# Patient Record
Sex: Female | Born: 2000 | Race: Asian | Hispanic: No | Marital: Married | Smoking: Never smoker
Health system: Southern US, Community
[De-identification: ages and names within clinical notes are randomized; demographics above are authoritative.]

## PROBLEM LIST (undated history)

## (undated) DIAGNOSIS — R569 Unspecified convulsions: Secondary | ICD-10-CM

## (undated) HISTORY — DX: Unspecified convulsions: R56.9

---

## 2020-05-07 ENCOUNTER — Ambulatory Visit (INDEPENDENT_AMBULATORY_CARE_PROVIDER_SITE_OTHER): Payer: Self-pay | Admitting: Family Medicine

## 2020-05-07 ENCOUNTER — Other Ambulatory Visit: Payer: Self-pay

## 2020-05-07 VITALS — BP 104/62 | HR 90 | Ht 65.0 in | Wt 110.4 lb

## 2020-05-07 DIAGNOSIS — R569 Unspecified convulsions: Secondary | ICD-10-CM

## 2020-05-07 DIAGNOSIS — Z0289 Encounter for other administrative examinations: Secondary | ICD-10-CM

## 2020-05-07 DIAGNOSIS — Z3400 Encounter for supervision of normal first pregnancy, unspecified trimester: Secondary | ICD-10-CM

## 2020-05-07 LAB — POCT URINALYSIS DIP (MANUAL ENTRY)
Bilirubin, UA: NEGATIVE
Blood, UA: NEGATIVE
Glucose, UA: NEGATIVE mg/dL
Ketones, POC UA: NEGATIVE mg/dL
Leukocytes, UA: NEGATIVE
Nitrite, UA: NEGATIVE
Protein Ur, POC: NEGATIVE mg/dL
Spec Grav, UA: 1.01 (ref 1.010–1.025)
Urobilinogen, UA: 0.2 E.U./dL
pH, UA: 6 (ref 5.0–8.0)

## 2020-05-07 NOTE — Patient Instructions (Addendum)
Referring to neurologist and high risk OB clinic Scheduling anatomy ultrasound  Go to the MAU at [facility] at [facility] if:  You have pain in your lower abdomen or pelvic area  Your water breaks.  Sometimes it is a big gush of fluid, sometimes it is just a trickle that keeps getting your underwear wet or running down your legs  You have vaginal bleeding.

## 2020-05-09 DIAGNOSIS — Z0289 Encounter for other administrative examinations: Secondary | ICD-10-CM | POA: Insufficient documentation

## 2020-05-09 DIAGNOSIS — Z3400 Encounter for supervision of normal first pregnancy, unspecified trimester: Secondary | ICD-10-CM | POA: Insufficient documentation

## 2020-05-09 LAB — CULTURE, OB URINE

## 2020-05-09 LAB — URINE CULTURE, OB REFLEX

## 2020-05-09 NOTE — Progress Notes (Signed)
Patient Name: Gabrielle Wilson Date of Birth: [birth year]   [facility] Initial Prenatal Visit  Gabrielle Wilson is a 19 y.o. year old G1P0 at 17wd who presents for her initial prenatal visit, seen in faculty OB clinic given urgency of care need. Patient is a recently arrived [place] refugee, has been in [place] for 5 days. She presents with her husband, Gabrielle Wilson. Pashto interpreter utilized during entire encounter.   Patient denies any vaginal bleeding or fluid leaking. Does feel baby move occasionally. Has had abdominal pain intermittently throughout this pregnancy, previously evaluated and deemed normal. Responds well to tylenol. She is taking a PNV.  Prenatal History: Pregnancy is dated via 63w4dultrasound (records available, will be scanned in).  Available prenatal labs from 02/21/20 reviewed and unremarkable. To summarize:  B+, Antibody screen negative, HbSAg negative, HIV negative, RPR nonreactive, rubella immune, Hgb 12.6. Quant gold negative.  Has NOT had hemoglobin electophoresis, OB urine culture, hep C, or varicella.   Has received immunizations for Tdap, COVID (2 doses), IPV, influenza. Also received MMR and varicella (these seem to have been administered prior to recognition of pregnancy).  Past Medical History:  No prior surgeries. Other than PNV and tylenol takes no medications regularly. This is her first pregnancy Denies any history of STI or HSV, or partner with HSV. History of abdominal infection at age 19-5 took medications and resolved. No further information available about this illness.  Reports history of seizures. Occur sporadically (approximately monthly, sometimes more, sometimes less). Episodes consist of falling to the ground and full body shaking. Has injured herself during these episodes in the past. Has full LOC during the episodes, but no loss of bladder control or tongue biting.  Not on any medications for this. Reports was previously  evaluated by a medical provider for this back in [place] and the issue was deemed to be "religious" in nature. They went to a religious healer and now when she has the episodes they pray for her until she improves. Episodes tend to happen more when she is stressed or upset. Husband is afraid to leave her alone for any length of time, as he is afraid she will get injured if she has another episode. He requests that his sister and brother in law be allowed to live with them to help monitor patient for her own safety.  Social History: [place] Here with her husband, also husband's sister and sister's husband. Left a lot of family back home in [place] and they are understandably very concerned about their families.  Denies smoking, drinking, or drug use  Family History: Mother with hypertension, otherwise negative No cancers in family No babies born with issues, or pregnancy-related complications in the family  Exam: BP 104/62   Pulse 90   Ht 5' 5" (1.651 m)   Wt 110 lb 6.4 oz (50.1 kg)   LMP 01/13/2020 (Approximate)   BMI 18.37 kg/m   Gen: Well nourished, well developed.  No distress.  HEENT: Normocephalic, atraumatic.  Neck supple without cervical lymphadenopathy, thyromegaly or thyroid nodules.  Fair dentition. CV: RRR no murmur, gallops or rubs Lungs: CTA B.  Normal respiratory effort without wheezes or rales. Abd: soft, NTND.  Uterus gravid, below umbilicus GU: exam deferred today by patient request Ext: No clubbing, cyanosis or edema. Psych: Normal grooming and dress.   Fetal heart tones: 158bpm  Assessment/Plan:  KSariah Henkinis a 19y.o. G1P0 newly arrived [place] at 152w0dho presents to  initiate prenatal care here in [place]  1. Routine prenatal care: - dating is reliable, based on u/s - available prenatal labs are unremarkable. Will draw full prenatal panel here for our records, along with hep C, hemoglobin electrophoresis,  varicella titer, urinalysis, OB urine culture. Also check lead level given refugee status. - continue prenatal vitamins - offered genetic screening, patient declined - need to attempt to obtain gc/chlamydia at next appointment. Patient strongly preferred to not do pelvic exam today. I did not push this today given ongoing trauma of resettlement. Advised will revisit at next appointment, patient agreeable. - anatomy ultrasound ordered and scheduled  - given history of seizures (see below) will need to transfer care to [facility] clinic - reviewed precautions/reasons to go to MAU  2. History of "seizures":  - has not had any evaluation of this since arriving in the [place], no records available of any prior workup. 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  - As our referral coordinator is out presently, I will plan to personally contact both neurology and [place] clinic next week to get these appts scheduled. Will then forward appointment information to refugee resettlement case worker. - Will also advocate with case worker that patient's husband's family be allowed to share housing with them in order to provide supervision for patient.   3. Abdominal pain - noted throughout pregnancy according to prenatalrecords. Seems benign, likely typical uterine ligament-related pains, responds to tylenol - continue to monitor   Next prenatal visit in 4 weeks (here if cannot get in with [facility] by then).  Gabrielle Netters, MD [place]

## 2020-05-09 NOTE — Assessment & Plan Note (Signed)
Has not had any evaluation of this since arriving in the [place], no records available of any prior workup. 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  - As our referral coordinator is out presently, I will plan to personally contact both neurology and [facility] clinic next week to get these appts scheduled. Will then forward appointment information to refugee resettlement case worker. - Will also advocate with case worker that patient's husband's family be allowed to share housing with them in order to provide supervision for patient.

## 2020-05-11 ENCOUNTER — Encounter: Payer: Self-pay | Admitting: Family Medicine

## 2020-05-11 ENCOUNTER — Telehealth: Payer: Self-pay | Admitting: Family Medicine

## 2020-05-11 NOTE — Telephone Encounter (Signed)
Care Coordination Note  - called and got patient scheduled for High Risk OB clinic visit with Dr. Crissie Reese on 1/4 at 8:55am  - called [facility] office to request appointment, was told referral needs to be reviewed by MD first. I have sent a message to Dr. Karel Jarvis explaining the need for urgent neuro referral (pregnant with likely seizure disorder). Provided neurology scheduler contact info for patient's case worker (see info below) so they can communicate appointment info through case worker.  Doha Altaki Case [employer] [place] [employer] Refuge[employer] [facility] CWS Office Phone: [phone] Work Cell: [phone]  - uploaded available refugee health records (including prenatal records) into patient's chart, available under media.  - sent information about above appts/referrals as well as anatomy ultrasound appointment to [facility].  Latrelle Dodrill, MD

## 2020-05-12 ENCOUNTER — Other Ambulatory Visit: Payer: Self-pay

## 2020-05-12 ENCOUNTER — Encounter: Payer: Self-pay | Admitting: Neurology

## 2020-05-12 ENCOUNTER — Ambulatory Visit (INDEPENDENT_AMBULATORY_CARE_PROVIDER_SITE_OTHER): Payer: Self-pay | Admitting: Neurology

## 2020-05-12 VITALS — BP 120/84 | HR 115 | Resp 20 | Wt 111.0 lb

## 2020-05-12 DIAGNOSIS — R569 Unspecified convulsions: Secondary | ICD-10-CM

## 2020-05-12 LAB — OBSTETRIC PANEL, INCLUDING HIV
Antibody Screen: NEGATIVE
Basophils Absolute: 0 10*3/uL (ref 0.0–0.2)
Basos: 0 %
EOS (ABSOLUTE): 0.4 10*3/uL (ref 0.0–0.4)
Eos: 5 %
HIV Screen 4th Generation wRfx: NONREACTIVE
Hematocrit: 28.9 % — ABNORMAL LOW (ref 34.0–46.6)
Hemoglobin: 9.7 g/dL — ABNORMAL LOW (ref 11.1–15.9)
Hepatitis B Surface Ag: NEGATIVE
Immature Grans (Abs): 0 10*3/uL (ref 0.0–0.1)
Immature Granulocytes: 0 %
Lymphocytes Absolute: 2.3 10*3/uL (ref 0.7–3.1)
Lymphs: 33 %
MCH: 29.3 pg (ref 26.6–33.0)
MCHC: 33.6 g/dL (ref 31.5–35.7)
MCV: 87 fL (ref 79–97)
Monocytes Absolute: 0.4 10*3/uL (ref 0.1–0.9)
Monocytes: 6 %
Neutrophils Absolute: 3.7 10*3/uL (ref 1.4–7.0)
Neutrophils: 56 %
Platelets: 225 10*3/uL (ref 150–450)
RBC: 3.31 x10E6/uL — ABNORMAL LOW (ref 3.77–5.28)
RDW: 13.4 % (ref 11.7–15.4)
RPR Ser Ql: NONREACTIVE
Rh Factor: POSITIVE
Rubella Antibodies, IGG: 2.6 index (ref >0.99)
WBC: 6.8 10*3/uL (ref 3.4–10.8)

## 2020-05-12 LAB — HGB FRACTIONATION BY HPLC
Hgb A: 55.4 % — ABNORMAL LOW (ref 96.4–98.8)
Hgb C: 0 %
Hgb E: 0 %
Hgb F: 0 % (ref 0.0–2.0)
Hgb S: 0 %
Hgb Variant: 42.6 % — ABNORMAL HIGH

## 2020-05-12 LAB — HCV AB W REFLEX TO QUANT PCR: HCV Ab: <0.1 s/co ratio (ref 0.0–0.9)

## 2020-05-12 LAB — HCV INTERPRETATION

## 2020-05-12 LAB — HGB FRACTIONATION CASCADE: Hgb A2: 2 % (ref 1.8–3.2)

## 2020-05-12 LAB — VARICELLA ZOSTER ANTIBODY, IGG: Varicella zoster IgG: 645 index (ref >165)

## 2020-05-12 LAB — LEAD, BLOOD (ADULT >= 16 YRS): Lead-Whole Blood: 1 ug/dL (ref 0–4)

## 2020-05-12 NOTE — Progress Notes (Signed)
NEUROLOGY CONSULTATION NOTE  Gabrielle Wilson MRN: [id number] DOB: [birth year]  Referring provider: Dr. Levert Feinstein Primary care provider: Dr. Levert Feinstein  Reason for consult:  Seizures, pregnant  Dear Dr Pollie Meyer:  Thank you for your kind referral of Gabrielle Wilson for consultation of the above symptoms. Although her history is well known to you, please allow me to reiterate it for the purpose of our medical record. The patient was accompanied to the clinic by her husband who also provides collateral information. A Dari medical interpreter [phone]) is present to help with translation. Records and images were personally reviewed where available.   HISTORY OF PRESENT ILLNESS: This is a 19 year old G1P0 at 73 wd right-handed woman presenting for evaluation of seizures.  She is a recently arrived [place] refugee and has been in [place] for over a week. Her husband Theador Hawthorne is present to provide history as well. They report that seizures started in childhood, she states that when she gets upset or feeling down, she goes through these episodes. There is no clear warning except sometimes she may have a little abdominal pain. When she wakes up, her whole body is in pain, including her teeth/tongue/mouth. Her husband has not been with her for a long time, he has witnessed 2 seizures but the women in the house covered her from him and his mother told him she has had these episodes since childhood. She bit her tongue that time. She denies nocturnal seizures. They deny any staring/unresponsive episodes, no  olfactory/gustatory hallucinations, rising epigastric sensation, focal numbness/tingling/weakness, myoclonic jerks. Sometimes she would feel very weak and her hand shivers and she cannot do anything. She has frequent headaches over the frontal region, when severe she gets nauseated, no vomiting. She is sensitive to bright lights and sounds. She does not take any medication. Family took her to  medical and religious treatment where she was given ?herbal treatments and her body was getting a little bit calm. They report that they have not had any tests because this is something God has given to her, family would recite the [place]. She denies any history of significant head injuries or family history of seizures.    PAST MEDICAL HISTORY: Past Medical History:  Diagnosis Date  . Seizure (HCC)     PAST SURGICAL HISTORY: History reviewed. No pertinent surgical history.  MEDICATIONS: Current Outpatient Medications on File Prior to Visit  Medication Sig Dispense Refill  . acetaminophen (TYLENOL) 500 MG tablet Take 500 mg by mouth every 6 (six) hours as needed.    . Prenatal Vit-Fe Fumarate-FA (MULTIVITAMIN-PRENATAL) 27-0.8 MG TABS tablet Take 1 tablet by mouth daily.     No current facility-administered medications on file prior to visit.    ALLERGIES: No Known Allergies  FAMILY HISTORY: History reviewed. No pertinent family history.  SOCIAL HISTORY: Social History   Socioeconomic History  . Marital status: Married    Spouse name: Not on file  . Number of children: Not on file  . Years of education: Not on file  . Highest education level: Not on file  Occupational History  . Not on file  Tobacco Use  . Smoking status: Never Smoker  . Smokeless tobacco: Never Used  Vaping Use  . Vaping Use: Never used  Substance and Sexual Activity  . Alcohol use: Not Currently  . Drug use: Not Currently  . Sexual activity: Not on file  Other Topics Concern  . Not on file  Social History Narrative   Right handed  Lives with her family husband and sister-in -Social worker   Social Determinants of Corporate investment banker Strain: Not on file  Food Insecurity: Not on file  Transportation Needs: Not on file  Physical Activity: Not on file  Stress: Not on file  Social Connections: Not on file  Intimate Partner Violence: Not on file     PHYSICAL EXAM: Vitals:   05/12/20 1241   BP: 120/84  Pulse: (!) 115  Resp: 20  SpO2: 100%   General: No acute distress Head:  Normocephalic/atraumatic Skin/Extremities: No rash, no edema Neurological Exam: Mental status: alert and awake. No dysarthria or aphasia, Fund of knowledge is appropriate. Attention and concentration are normal.    Cranial nerves: CN I: not tested CN II: pupils equal, round and reactive to light, visual fields intact CN III, IV, VI:  full range of motion, no nystagmus, no ptosis CN V: facial sensation intact CN VII: upper and lower face symmetric CN VIII: hearing intact to conversation Bulk & Tone: normal, no fasciculations. Motor: 5/5 throughout with no pronator drift. Sensation: intact to light touch, cold, pin, vibration and joint position sense.  No extinction to double simultaneous stimulation.  Romberg test negative Deep Tendon Reflexes: +1 throughout Cerebellar: no incoordination on finger to nose testing Gait: narrow-based and steady, able to tandem walk adequately. Tremor: none   IMPRESSION: This is a 19 year old G1P0 at 55 wd right-handed woman presenting for evaluation of seizures. Seizures suggestive of generalized epilepsy, however she also reports that they occur during times of increased emotions. A 1-hour EEG will be ordered to classify her seizures. Low threshold to start medication, she is pregnant, will consider Levetiracetam after EEG completed. She does not drive. Follow-up in 2 months, they know to call for any changes.   Thank you for allowing me to participate in the care of this patient. Please do not hesitate to call for any questions or concerns.   Patrcia Dolly, M.D.  CC: Dr. Pollie Meyer

## 2020-05-12 NOTE — Progress Notes (Signed)
Interpreter [phone] used for this appointment pt speaks Pashto not Dari.

## 2020-05-12 NOTE — Patient Instructions (Signed)
1. Schedule 1-hour EEG  2. Follow-up after EEG, call for any changes

## 2020-05-23 NOTE — L&D Delivery Note (Addendum)
Delivery Note Gabrielle Wilson is a 20 y.o. G1P0000 at [redacted]w[redacted]d admitted for early labor.   GBS Status: NEGATIVE/-- (05/30 0639) Maximum Maternal Temperature: 98.2  Labor course: Initial SVE: 3/80/-2. Augmentation with: AROM. She then progressed to complete.  ROM: 2h 45m with light meconium stained fluid  Birth: At 1352 a viable female was delivered via spontaneous vaginal delivery (Presentation: LOA). Nuchal cord present: No.  Shoulders and body delivered in usual fashion. Infant placed directly on mom's abdomen for bonding/skin-to-skin, baby dried and stimulated. Cord clamped x 2 after 1 minute and cut by FOB. Skin peeling on feet. Cord blood collected.  The placenta separated spontaneously and delivered via gentle cord traction.  Pitocin infused rapidly IV per protocol.  Fundus firm with massage.  Placenta inspected and appears to be intact with a 3 VC.  Placenta/Cord with the following complications: none .  Cord pH: not done Sponge and instrument count were correct x2.  Intrapartum complications:  None Anesthesia:  IV Fentanyl Episiotomy: none Lacerations:  Hemostatic Lt periurethral-not repaired Suture Repair: n/a EBL (mL): 50   Infant: APGAR (1 MIN): 8   APGAR (5 MINS): 9   APGAR (10 MINS):    Infant weight: pending  Mom to postpartum.  Baby to Couplet care / Skin to Skin. Placenta to L&D   Plans to Breastfeed Contraception: condoms Circumcision: N/A  Note sent to [facility]: [facility] for pp visit.  Cheral Marker CNM, [facility] 10/19/2020 2:03 PM

## 2020-05-25 ENCOUNTER — Other Ambulatory Visit: Payer: Self-pay

## 2020-05-26 ENCOUNTER — Encounter: Payer: Self-pay | Admitting: Family Medicine

## 2020-06-05 ENCOUNTER — Ambulatory Visit (INDEPENDENT_AMBULATORY_CARE_PROVIDER_SITE_OTHER): Payer: Self-pay | Admitting: Family Medicine

## 2020-06-05 ENCOUNTER — Other Ambulatory Visit: Payer: Self-pay

## 2020-06-05 ENCOUNTER — Encounter: Payer: Self-pay | Admitting: Family Medicine

## 2020-06-05 ENCOUNTER — Other Ambulatory Visit: Payer: Self-pay | Admitting: *Deleted

## 2020-06-05 ENCOUNTER — Other Ambulatory Visit: Payer: Self-pay | Admitting: Family Medicine

## 2020-06-05 ENCOUNTER — Ambulatory Visit: Payer: Medicaid Other | Attending: Family Medicine

## 2020-06-05 VITALS — BP 124/72 | HR 105 | Wt 116.2 lb

## 2020-06-05 DIAGNOSIS — Z3400 Encounter for supervision of normal first pregnancy, unspecified trimester: Secondary | ICD-10-CM

## 2020-06-05 DIAGNOSIS — Z363 Encounter for antenatal screening for malformations: Secondary | ICD-10-CM | POA: Insufficient documentation

## 2020-06-05 DIAGNOSIS — R569 Unspecified convulsions: Secondary | ICD-10-CM

## 2020-06-05 DIAGNOSIS — Z3402 Encounter for supervision of normal first pregnancy, second trimester: Secondary | ICD-10-CM | POA: Diagnosis present

## 2020-06-05 DIAGNOSIS — Z789 Other specified health status: Secondary | ICD-10-CM

## 2020-06-05 DIAGNOSIS — Z362 Encounter for other antenatal screening follow-up: Secondary | ICD-10-CM

## 2020-06-05 NOTE — Progress Notes (Signed)
Rescheduled pt's EEG at [facility] on 06/17/20 @ 1415.  Pt notified.    Addison Naegeli, RN  06/05/20

## 2020-06-05 NOTE — Progress Notes (Addendum)
  Subjective:  Gabrielle Wilson is a G1P0000 [redacted]w[redacted]d being seen today for her first obstetrical visit. She received first trimester care in [place]. Her obstetrical history is significant for possible seizure disorder. This is her first pregnancy. Desired pregnancy. Patient does intend to breast feed. Pregnancy history fully reviewed.  Patient reports no complaints.  BP 124/72   Pulse (!) 105   Wt 116 lb 3.2 oz (52.7 kg)   LMP 01/13/2020 (Approximate)   BMI 19.34 kg/m   HISTORY: OB History  Gravida Para Term Preterm AB Living  1 0 0 0 0 0  SAB IAB Ectopic Multiple Live Births  0 0 0 0 0    # Outcome Date GA Lbr Len/2nd Weight Sex Delivery Anes PTL Lv  1 Current             Past Medical History:  Diagnosis Date  . Seizure [facility])     History reviewed. No pertinent surgical history.  No family history on file.   Exam  BP 124/72   Pulse (!) 105   Wt 116 lb 3.2 oz (52.7 kg)   LMP 01/13/2020 (Approximate)   BMI 19.34 kg/m   Chaperone present during exam  CONSTITUTIONAL: Well-developed, well-nourished female in no acute distress.  HENT:  Normocephalic, atraumatic, External right and left ear normal. Oropharynx is clear and moist EYES: Conjunctivae and EOM are normal. Pupils are equal, round, and reactive to light. No scleral icterus.  NECK: Normal range of motion, supple, no masses. CARDIOVASCULAR: Normal heart rate noted, regular rhythm RESPIRATORY: Clear to auscultation bilaterally. Effort and breath sounds normal, no problems with respiration noted. BREASTS: declined ABDOMEN: Soft, normal bowel sounds, no distention noted.  No tenderness, rebound or guarding.  PELVIC: declined MUSCULOSKELETAL: Normal range of motion. No tenderness.  No cyanosis, clubbing, or edema.  2+ distal pulses. SKIN: Skin is warm and dry. No rash noted. Not diaphoretic. No erythema. No pallor. NEUROLOGIC: Alert and oriented to person, place, and time. Normal reflexes, muscle tone coordination.  No cranial nerve deficit noted. PSYCHIATRIC: Normal mood and affect. Normal behavior. Normal judgment and thought content.    Assessment:    Pregnancy: G1P0000 Patient Active Problem List   Diagnosis Date Noted  . Supervision of normal first pregnancy 05/09/2020  . Refugee health examination 05/09/2020  . Seizures (HCC)       Plan:   1. Supervision of normal first pregnancy, antepartum Discussed delivery at [facility].  [place] today[place] refugee [place] - normal PNL> - CHL AMB BABYSCRIPTS SCHEDULE OPTIMIZATION  2. Seizures (HCC) Has seen neurologist. Missed EEG. Will try to get her set up for the EEG. Has f/u with the neurologist in about a month.  3. Language barrier Interpreter used   Problem list reviewed and updated. 75% of 30 min visit spent on counseling and coordination of care.     Levie Heritage 06/05/2020

## 2020-06-10 ENCOUNTER — Other Ambulatory Visit: Payer: Self-pay | Admitting: Family Medicine

## 2020-06-10 ENCOUNTER — Encounter: Payer: Self-pay | Admitting: Family Medicine

## 2020-06-10 DIAGNOSIS — D582 Other hemoglobinopathies: Secondary | ICD-10-CM

## 2020-06-10 DIAGNOSIS — Z148 Genetic carrier of other disease: Secondary | ICD-10-CM

## 2020-06-10 HISTORY — DX: Other hemoglobinopathies: D58.2

## 2020-06-10 HISTORY — DX: Genetic carrier of other disease: Z14.8

## 2020-06-15 NOTE — Progress Notes (Signed)
Verified with MFM patient will be scheduled for genetic counseling appt.

## 2020-06-17 ENCOUNTER — Ambulatory Visit (INDEPENDENT_AMBULATORY_CARE_PROVIDER_SITE_OTHER): Payer: Medicaid Other | Admitting: Neurology

## 2020-06-17 ENCOUNTER — Other Ambulatory Visit: Payer: Self-pay

## 2020-06-17 DIAGNOSIS — R569 Unspecified convulsions: Secondary | ICD-10-CM

## 2020-06-24 NOTE — Procedures (Signed)
ELECTROENCEPHALOGRAM REPORT  Date of Study: 06/17/2020  Patient's Name: Gabrielle Wilson MRN: [id number] Date of Birth: 2000-10-16  Referring Provider: Dr. Patrcia Dolly  Clinical History: This is a 20 year old woman with recurrent seizures, currently pregnant. EEG for classification.  Medications: TYLENOL 500 MG tablet MULTIVITAMIN-PRENATAL 27-0.8 MG TABS  Technical Summary: A multichannel digital 1-hour EEG recording measured by the international 10-20 system with electrodes applied with paste and impedances below 5000 ohms performed in our laboratory with EKG monitoring in an awake and asleep patient.  Hyperventilation was not performed. Photic stimulation was performed.  The digital EEG was referentially recorded, reformatted, and digitally filtered in a variety of bipolar and referential montages for optimal display.    Description: The patient is awake and asleep during the recording.  During maximal wakefulness, there is a symmetric, medium voltage 10 Hz posterior dominant rhythm that attenuates with eye opening.  The record is symmetric.  During drowsiness and sleep, there is an increase in theta slowing of the background.  Vertex waves and symmetric sleep spindles were seen.  Photic stimulation did not elicit any abnormalities.  There were no epileptiform discharges or electrographic seizures seen.    EKG lead was unremarkable.  Impression: This 1-hour awake and asleep EEG is normal.    Clinical Correlation: A normal EEG does not exclude a clinical diagnosis of epilepsy.  If further clinical questions remain, prolonged EEG may be helpful.  Clinical correlation is advised.   Patrcia Dolly, M.D.

## 2020-07-02 ENCOUNTER — Other Ambulatory Visit: Payer: Self-pay

## 2020-07-02 ENCOUNTER — Ambulatory Visit (INDEPENDENT_AMBULATORY_CARE_PROVIDER_SITE_OTHER): Payer: Medicaid Other | Admitting: Neurology

## 2020-07-02 ENCOUNTER — Encounter: Payer: Self-pay | Admitting: Neurology

## 2020-07-02 VITALS — BP 122/76 | HR 89 | Ht 65.0 in | Wt 125.2 lb

## 2020-07-02 DIAGNOSIS — R569 Unspecified convulsions: Secondary | ICD-10-CM | POA: Diagnosis not present

## 2020-07-02 MED ORDER — LEVETIRACETAM 500 MG PO TABS: ORAL_TABLET | ORAL | 11 refills | Status: DC

## 2020-07-02 NOTE — Patient Instructions (Signed)
1. Start Keppra (Levetiracetam) 500mg : take 1/2 tablet in morning, 1/2 tablet in evening for 1 week, then increase to 1 tablet in morning, 1 tablet in evening  2. You will need bloodwork for Keppra level next month  3. Follow-up in 3 months, call for any changes

## 2020-07-02 NOTE — Progress Notes (Signed)
NEUROLOGY FOLLOW UP OFFICE NOTE  Kura Bethards [id number] 2000/08/09  HISTORY OF PRESENT ILLNESS: I had the pleasure of seeing Brittinee Risk in follow-up in the neurology clinic on 07/02/2020.  The patient was last seen 2 months ago for seizures. She is again accompanied by her husband [name] who helps supplement the history today. A Pashto medical interpreter, [place]ara, helps with translation.  Records and images were personally reviewed where available.  Her 1-hour EEG was normal. Since her last visit, she and her husband deny any seizures or seizure-like symptoms. Her husband reports she is doing better. They deny any convulsive activity, myoclonic jerks, focal numbness/tingling/weakness, headaches, dizziness, no falls. Sleep is good. There were a few nights she had a hard time breathing, most recently 2 nights ago.    History on Initial Assessment 05/12/2020: This is a 20 year old G1P0 at 2 wd right-handed woman presenting for evaluation of seizures.  She is a recently arrived [place] refugee and has been in [place] for over a week. Her husband Theador Hawthorne is present to provide history as well. They report that seizures started in childhood, she states that when she gets upset or feeling down, she goes through these episodes. There is no clear warning except sometimes she may have a little abdominal pain. When she wakes up, her whole body is in pain, including her teeth/tongue/mouth. Her husband has not been with her for a long time, he has witnessed 2 seizures but the women in the house covered her from him and his mother told him she has had these episodes since childhood. She bit her tongue that time. She denies nocturnal seizures. They deny any staring/unresponsive episodes, no  olfactory/gustatory hallucinations, rising epigastric sensation, focal numbness/tingling/weakness, myoclonic jerks. Sometimes she would feel very weak and her hand shivers and she cannot do anything. She has frequent  headaches over the frontal region, when severe she gets nauseated, no vomiting. She is sensitive to bright lights and sounds. She does not take any medication. Family took her to medical and religious treatment where she was given ?herbal treatments and her body was getting a little bit calm. They report that they have not had any tests because this is something God has given to her, family would recite the [place]. She denies any history of significant head injuries or family history of seizures.   PAST MEDICAL HISTORY: Past Medical History:  Diagnosis Date  . Seizure [facility])     MEDICATIONS: Current Outpatient Medications on File Prior to Visit  Medication Sig Dispense Refill  . acetaminophen (TYLENOL) 500 MG tablet Take 500 mg by mouth every 6 (six) hours as needed.    . Prenatal Vit-Fe Fumarate-FA (MULTIVITAMIN-PRENATAL) 27-0.8 MG TABS tablet Take 1 tablet by mouth daily.     No current facility-administered medications on file prior to visit.    ALLERGIES: No Known Allergies  FAMILY HISTORY: History reviewed. No pertinent family history.  SOCIAL HISTORY: Social History   Socioeconomic History  . Marital status: Married    Spouse name: Not on file  . Number of children: Not on file  . Years of education: Not on file  . Highest education level: Not on file  Occupational History  . Not on file  Tobacco Use  . Smoking status: Never Smoker  . Smokeless tobacco: Never Used  Vaping Use  . Vaping Use: Never used  Substance and Sexual Activity  . Alcohol use: Not Currently  . Drug use: Not Currently  . Sexual activity: Not  on file  Other Topics Concern  . 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 No aphasia or dysarthria. Fund of knowledge is appropriate.   Attention and concentration are normal.   Cranial nerves: Pupils equal, round. Extraocular movements intact. No facial asymmetry.  Motor: moves all extremities symmetrically. Gait narrow-based and steady.    IMPRESSION: This is a 20 yo G1P0 at 50 wd right-handed woman with a history of recurrent seizures suggestive of generalized epilepsy, however she also reports they occur during times of increased emotions. Her EEG was normal. She has not had any seizures since they moved to the [place]. They would like to start seizure medication, side effects of Levetiracetam discussed. Issues in women with epilepsy discussed, she will need monthly Keppra levels during her pregnancy. Start Levetiracetam 500mg  1/2 tab BID for 1 week, then increase to 1 tablet BID. She does not drive. Continue follow-up with OB. Follow-up in 3 months, they know to call for any changes.   Thank you for allowing me to participate in her care.  Please do not hesitate to call for any questions or concerns.   , M.D.   CC: Dr. Patrcia Dolly, Dr. Pollie Meyer

## 2020-07-03 ENCOUNTER — Ambulatory Visit: Payer: Medicaid Other

## 2020-07-03 ENCOUNTER — Encounter: Payer: Self-pay | Admitting: Student

## 2020-07-03 NOTE — Progress Notes (Deleted)
Patient did not keep appt; she should be rescheduled.  

## 2020-07-07 ENCOUNTER — Ambulatory Visit: Payer: Medicaid Other | Admitting: *Deleted

## 2020-07-07 ENCOUNTER — Other Ambulatory Visit: Payer: Self-pay

## 2020-07-07 ENCOUNTER — Encounter: Payer: Self-pay | Admitting: *Deleted

## 2020-07-07 ENCOUNTER — Ambulatory Visit: Payer: Medicaid Other | Attending: Obstetrics

## 2020-07-07 VITALS — BP 120/64 | HR 83

## 2020-07-07 DIAGNOSIS — G40909 Epilepsy, unspecified, not intractable, without status epilepticus: Secondary | ICD-10-CM | POA: Diagnosis present

## 2020-07-07 DIAGNOSIS — Z3A25 25 weeks gestation of pregnancy: Secondary | ICD-10-CM | POA: Diagnosis not present

## 2020-07-07 DIAGNOSIS — O99012 Anemia complicating pregnancy, second trimester: Secondary | ICD-10-CM

## 2020-07-07 DIAGNOSIS — O99352 Diseases of the nervous system complicating pregnancy, second trimester: Secondary | ICD-10-CM | POA: Diagnosis not present

## 2020-07-07 DIAGNOSIS — Z362 Encounter for other antenatal screening follow-up: Secondary | ICD-10-CM | POA: Insufficient documentation

## 2020-07-07 NOTE — Progress Notes (Signed)
Unable to complete nurse visit due to lack of interpreter services through language resources, [place] and [address].

## 2020-07-08 ENCOUNTER — Other Ambulatory Visit: Payer: Self-pay | Admitting: *Deleted

## 2020-07-08 DIAGNOSIS — Z3689 Encounter for other specified antenatal screening: Secondary | ICD-10-CM

## 2020-07-16 ENCOUNTER — Encounter (HOSPITAL_COMMUNITY): Payer: Self-pay | Admitting: Urgent Care

## 2020-07-16 ENCOUNTER — Encounter: Payer: Self-pay | Admitting: *Deleted

## 2020-07-16 ENCOUNTER — Ambulatory Visit (HOSPITAL_COMMUNITY)
Admission: EM | Admit: 2020-07-16 | Discharge: 2020-07-16 | Disposition: A | Discharge status: Home / Self Care | Payer: Medicaid Other | Attending: Family Medicine | Admitting: Family Medicine

## 2020-07-16 ENCOUNTER — Other Ambulatory Visit: Payer: Self-pay

## 2020-07-16 DIAGNOSIS — B09 Unspecified viral infection characterized by skin and mucous membrane lesions: Secondary | ICD-10-CM

## 2020-07-16 DIAGNOSIS — L089 Local infection of the skin and subcutaneous tissue, unspecified: Secondary | ICD-10-CM

## 2020-07-16 DIAGNOSIS — S00521A Blister (nonthermal) of lip, initial encounter: Secondary | ICD-10-CM

## 2020-07-16 MED ORDER — ACYCLOVIR 400 MG PO TABS: 400.0000 mg | ORAL_TABLET | Freq: Three times a day (TID) | ORAL | 0 refills | Status: AC

## 2020-07-16 MED ORDER — VALACYCLOVIR HCL 1 G PO TABS: 1000.0000 mg | ORAL_TABLET | Freq: Three times a day (TID) | ORAL | 0 refills | Status: DC

## 2020-07-16 MED ORDER — CEPHALEXIN 500 MG PO CAPS: 500.0000 mg | ORAL_CAPSULE | Freq: Three times a day (TID) | ORAL | 0 refills | Status: DC

## 2020-07-16 MED ORDER — CEPHALEXIN 500 MG PO CAPS: 500.0000 mg | ORAL_CAPSULE | Freq: Three times a day (TID) | ORAL | 0 refills | Status: AC

## 2020-07-16 NOTE — ED Provider Notes (Signed)
[facility]    CSN: [id number] Arrival date & time: 07/16/20  5400      History   Chief Complaint Chief Complaint  Patient presents with  . Fever  . Facial Injury    HPI Gabrielle Wilson is a 20 y.o. female.   HPI  Patient presents today accompanied by her husband both are known [place] speakers and recently immigrated here from [place].  [place] interpreter on phone interpreting during visit today.  Spouse is providing the history.  Spouse reports patient developed blistering burning lesions on the right side of her nose and right upper lip which has persistently burned with mild itching and pain over the last 4 days.  Denies any changes in diet or changes soaps. Patient is also approximately 6 months pregnant and has been reports she has received prenatal care monthly and has a prenatal care provider.  She denies any abdominal pain, any vaginal bleeding, or nausea or vomitus. Patient is unable to give any specifics regarding medical history.   History reviewed. No pertinent past medical history.  There are no problems to display for this patient.   History reviewed. No pertinent surgical history.  OB History    Gravida  1   Para      Term      Preterm      AB      Living        SAB      IAB      Ectopic      Multiple      Live Births               Home Medications    Prior to Admission medications   Medication Sig Start Date End Date Taking? Authorizing Provider  cephALEXin (KEFLEX) 500 MG capsule Take 1 capsule (500 mg total) by mouth 3 (three) times daily for 7 days. 07/16/20 07/23/20 Yes Bing Neighbors, FNP  valACYclovir (VALTREX) 1000 MG tablet Take 1 tablet (1,000 mg total) by mouth 3 (three) times daily for 14 days. 07/16/20 07/30/20 Yes Bing Neighbors, FNP    Family History History reviewed. No pertinent family history.  Social History Social History   Tobacco Use  . Smoking status: Never Smoker  . Smokeless  tobacco: Never Used  Substance Use Topics  . Alcohol use: Not Currently     Allergies   Patient has no known allergies.   Review of Systems Review of Systems Pertinent negatives listed in HPI  Physical Exam Triage Vital Signs ED Triage Vitals  Enc Vitals Group     BP 07/16/20 0914 112/75     Pulse Rate 07/16/20 0914 99     Resp 07/16/20 0914 17     Temp 07/16/20 0914 98.1 F (36.7 C)     Temp Source 07/16/20 0914 Oral     SpO2 07/16/20 0914 98 %     Weight --      Height --      Head Circumference --      Peak Flow --      Pain Score 07/16/20 0910 3     Pain Loc --      Pain Edu? --      Excl. in GC? --    No data found.  Updated Vital Signs BP 112/75 (BP Location: Left Arm)   Pulse 99   Temp 98.1 F (36.7 C) (Oral)   Resp 17   SpO2 98%   Visual Acuity Right Eye  Distance:   Left Eye Distance:   Bilateral Distance:    Right Eye Near:   Left Eye Near:    Bilateral Near:     Physical Exam Constitutional:      Appearance: Normal appearance.     Comments: Small stature and small frame appears younger than stated age.  HENT:     Head:      Mouth/Throat:     Comments: Upper lip blistering lesion with surrounding erythema and swelling with tenderness expanding away from vesicular cluster. Lower lip unaffected  Cardiovascular:     Rate and Rhythm: Normal rate and regular rhythm.  Pulmonary:     Breath sounds: Normal breath sounds.  Musculoskeletal:     Cervical back: Normal range of motion.  Skin:    Capillary Refill: Capillary refill takes less than 2 seconds.  Neurological:     Mental Status: She is alert.      UC Treatments / Results  Labs (all labs ordered are listed, but only abnormal results are displayed) Labs Reviewed - No data to display  EKG   Radiology No results found.  Procedures Procedures (including critical care time)  Medications Ordered in UC Medications - No data to display  Initial Impression / Assessment and Plan  / UC Course  I have reviewed the triage vital signs and the nursing notes.  Pertinent labs & imaging results that were available during my care of the patient were reviewed by me and considered in my medical decision making (see chart for details).     Blistering lesions are consistent with that of likely viral herpetic lesions appearing on the nare and upper lip.  Given limited medical history uncertain if patient has a history of HSV.  History is mostly provided by husband given cultural belief patient provided limited information pertinent to her history.  We will treat with acyclovir and a course of Keflex given the swelling and tenderness of the lower lip extending away from the vesicular cluster.  Advised to return if vesicular lesions appear around the eyes or develop within the oral mucosa.  Patient is pregnant and advised to continue with follow-up of prenatal care.  Verified with husband that patient has a OB/GYN however he subsequently told the nurse during discharge that patient did not have a OB/GYN nurse did provide patient's husband with a  list of resources. Final Clinical Impressions(s) / UC Diagnoses   Final diagnoses:  Viral infections characterized by skin and mucous membrane lesions  Blister of lip with infection, initial encounter     Discharge Instructions               .       .                     Hubbard Robinson kulin min al'adwiat kama hu mwswf wa'akmal aldawrat aleilajiat alkamilata. aishrab alkathir min alma' 'athna' tanawul aldawa'i. 'iidha tafaqam altafh aljildiu 'aw tatawar bialqurb min aleayn 'aw dakhil alfam , faintaqal fwran 'iilaa qism altawari li'iijra' taqyim tariin.    ED Prescriptions    Medication Sig Dispense Auth. Provider   valACYclovir (VALTREX) 1000 MG tablet Take 1 tablet (1,000 mg  total) by mouth 3 (three) times daily for 14 days. 42 tablet Bing Neighbors, FNP   cephALEXin (KEFLEX) 500 MG capsule Take 1 capsule (500 mg total) by mouth 3 (three) times daily for 7 days. 21 capsule Bing Neighbors, FNP     PDMP not  reviewed this encounter.   Bing Neighbors, [place] 07/16/20 [phone]

## 2020-07-16 NOTE — ED Triage Notes (Signed)
Pt presents with fever and facial swelling xs 3 days. States was due to heat.

## 2020-07-16 NOTE — Discharge Instructions (Addendum)
???? ?? ?? ??????? ??? ?? ????? ????? ?????? ???????? ???????. ???? ?????? ?? ????? ????? ????? ??????. ??? ????? ????? ?????? ?? ???? ?????? ?? ????? ?? ???? ???? ? ?????? ????? ??? ??? ??????? ?????? ????? ????.    tanawal kulin min al'adwiat kama hu mwswf wa'akmal aldawrat aleilajiat alkamilata. aishrab alkathir min alma' 'athna' tanawul aldawa'i. 'iidha tafaqam altafh aljildiu 'aw tatawar bialqurb min aleayn 'aw dakhil alfam , faintaqal fwran 'iilaa qism altawari li'iijra' taqyim tariin.

## 2020-08-17 ENCOUNTER — Ambulatory Visit: Payer: Self-pay | Admitting: Genetic Counselor

## 2020-08-17 ENCOUNTER — Ambulatory Visit (HOSPITAL_BASED_OUTPATIENT_CLINIC_OR_DEPARTMENT_OTHER): Payer: Medicaid Other | Admitting: Genetic Counselor

## 2020-08-17 ENCOUNTER — Other Ambulatory Visit: Payer: Self-pay

## 2020-08-17 ENCOUNTER — Encounter: Payer: Self-pay | Admitting: *Deleted

## 2020-08-17 ENCOUNTER — Ambulatory Visit: Payer: Medicaid Other | Attending: Obstetrics

## 2020-08-17 ENCOUNTER — Ambulatory Visit: Payer: Medicaid Other | Admitting: *Deleted

## 2020-08-17 VITALS — BP 132/64 | HR 88

## 2020-08-17 DIAGNOSIS — Z315 Encounter for genetic counseling: Secondary | ICD-10-CM | POA: Diagnosis not present

## 2020-08-17 DIAGNOSIS — Z3689 Encounter for other specified antenatal screening: Secondary | ICD-10-CM | POA: Diagnosis present

## 2020-08-17 DIAGNOSIS — G40909 Epilepsy, unspecified, not intractable, without status epilepticus: Secondary | ICD-10-CM | POA: Insufficient documentation

## 2020-08-17 DIAGNOSIS — D582 Other hemoglobinopathies: Secondary | ICD-10-CM | POA: Diagnosis not present

## 2020-08-17 NOTE — Progress Notes (Signed)
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 Gabrielle Wilson was accompanied to her appointment by her husband, Gabrielle Wilson. This session was facilitated by an in-person Pashto interpreter.  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 female. Her current pregnancy has completed [redacted]w[redacted]d (Estimated Date of Delivery: 10/15/20).  Gabrielle Wilson denied exposure to environmental toxins or chemical agents. She denied the use of alcohol, tobacco or street drugs. She denied significant viral illnesses, fevers, and bleeding during the course of her pregnancy. Her medical and surgical histories were noncontributory.  Family History  A three generation pedigree was drafted and reviewed. The family history is remarkable for the following:  - Gabrielle Wilson has a personal history of seizures. Her seizures are currently well-controlled on medication and she has followed up with Neurology during the current pregnancy. She never had genetic testing to attempt to determine the etiology of her seizures. We discussed that seizures can occur secondary to a variety of environmental, lifestyle, and genetic factors. When epilepsy does not have an identified genetic cause, the chance that a child of an affected mother will also have or develop epilepsy is approximately 4%. However, without knowing the etiology of Gabrielle Wilson's seizures, precise risk assessment is limited.  The remaining family histories were reviewed and found to be noncontributory for birth defects, intellectual disability, recurrent pregnancy loss, and known genetic conditions.    The patient's country of origin is [place]. The father of the pregnancy's country of origin is [place]. Ashkenazi Jewish  ancestry and consanguinity were denied. Pedigree will be scanned under Media.  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   Hemoglobin D is a variant form of hemoglobin caused by a specific mutation in the HBB gene. The HBB gene encodes for a protein called beta-globin, which is a subunit of hemoglobin. Hemoglobin within red blood cells binds to oxygen molecules in the lungs and delivers oxygen to tissues throughout the body. We discussed that there are many different types of hemoglobin, with hemoglobin A being the most common form. Mutations in the HBB gene cause variant forms of hemoglobin to be produced, such as hemoglobin D and hemoglobin S. Individuals with hemoglobin D trait are carriers for hemoglobin D disease (HbD disease), whereas individuals with hemoglobin S trait are carriers for sickle cell disease. 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 Her specific variant is referred to as hemoglobin D-[place] AKA hemoglobin D-[place]. This variant is the form of hemoglobin D that is most common in people of [place]iatic [place] descent.    Individuals with HbD disease have red blood cells that contain mostly hemoglobin D. Too much hemoglobin D can reduce the number and size of red blood cells in the body, causing mild anemia.  We discussed that as a result, individuals with HbD disease may experience symptoms such as weakness, fatigue, and splenomegaly. Carriers of the condition typically do not show any symptoms. HbD disease is inherited in an autosomal recessive pattern, where both parents must carry hemoglobin D trait to be at risk of having an affected child. If Gabrielle Wilson  husband were also a carrier of HbD disease, they would have a 1 in 4 (25%) chance of having a child with HbD disease.     It is also possible that Gabrielle Wilson's husband could carry another variant form of hemoglobin, such as hemoglobin S. If he did, the couple would have a 1 in 4 (25%) chance of having a child with hemoglobin sickle D disease (HbSD disease). Individuals with HbSD disease have red blood cells that contain both hemoglobin S and hemoglobin D. These variant forms of hemoglobin can cause red blood cells to become rigid and sickle, blocking small blood vessels and making it difficult for the red blood cells to deliver oxygen to the body's tissues. HbSD disease is a milder version of sickle cell disease. Individuals with HbSD disease are at risk of the same complications as those associated with sickle cell disease. However, most individuals with HbSD disease have fewer infections, less spleen involvement, fewer pain episodes, and less organ damage than those with sickle cell disease.  Finally, Gabrielle Wilson husband may have a different variant in the HBB gene that could make him a carrier of beta-thalassemia. If he did, the couple would have a 1 in 4 (25%) chance of having a child with HbD/beta-thalassemia disease. The severity of HbD/beta-thalassemia depends on the normal amount of beta globin that is produced. Individuals with HbD/beta-thalassemia may experience anemia and splenomegaly among some other complications. However, HbD/beta-thalassemia is generally considered fairly benign.    Based on the pan-ethnic carrier frequency for HBB-related hemoglobinopathies, Gabrielle Wilson husband has a 1 in 15 chance of carrying a variant in the HBB gene. We reviewed that it is recommended that he undergo carrier screening to refine the risks for the current fetus and the couple's future children to be affected with HbD disease, HbSD disease, or HbD/beta-thalassemia. Mr. Nowakowski declined carrier screening at this time. The couple was informed that all babies born in [place] are screened for hemoglobinopathies as a part  of [place]'s newborn screen.  Additional carrier screening options:  Per ACOG recommendation, carrier screening for cystic fibrosis (CF) and spinal muscular atrophy (SMA) was also discussed including information about the conditions, rationale for testing, autosomal recessive inheritance, and the option of prenatal diagnosis. I offered carrier screening for these additional conditions, which Ms. Canino declined at this time. Without carrier screening to refine risk and based on the pan-ethnic carrier frequencies for the conditions, Ms. Hirschmann chance of being a carrier for CF is 1 in 52 and her chance of being a carrier for SMA is 1 in 68. Ms. Mennen was informed that CF and SMA are also included on [place] [facility]'s newborn screen.   Aneuploidy screening:  Given that Ms. Kidd had not had aneuploidy screening performed during the pregnancy, we discussed noninvasive prenatal screening (NIPS) as an available option. Specifically, we discussed that NIPS analyzes cell free DNA originating from the placenta that is found in the maternal blood circulation during pregnancy. This test is not diagnostic for chromosome conditions, but can provide information regarding the presence or absence of extra fetal DNA for chromosomes 13, 18, 21, and the sex chromosomes. Thus, it would not identify or rule out all fetal aneuploidy. The reported detection rate is 91-99% for trisomies 21, 18, 13, and sex chromosome aneuploidies. The false positive rate is reported to be less than 0.1% for any of these conditions. Ms. Boston indicated that she is not interested in undergoing NIPS.  Diagnostic testing:  Ms. Kleinsasser was  also counseled regarding diagnostic testing via amniocentesis. We discussed the technical aspects of the procedure and quoted up to a 1 in 500 (0.2%) risk for preterm labor or other adverse pregnancy outcomes as a result of amniocentesis. Cultured cells from an amniocentesis sample allow for the  visualization of a fetal karyotype, which can detect >99% of large chromosomal aberrations. Chromosomal microarray can also be performed to identify smaller deletions or duplications of fetal chromosomal material. Amniocentesis could also be performed to assess whether the baby is affected by a hemoglobinopathy. After careful consideration, Ms. Chinchilla declined amniocentesis at this time. She understands that amniocentesis is available at any point after 16 weeks of pregnancy and that she may opt to undergo the procedure at a later date should she change her mind.  Plan:  Additional screening and diagnostic testing were declined today. The couple wished to wait for newborn screening to determine whether or not the baby has a hemoglobinopathy. They understand that screening tests, including ultrasound, cannot rule out all birth defects or genetic syndromes.  I counseled Ms. Caban regarding the above risks and available options. Second year [employer] genetic counseling student Liborio Nixon Davis-Ketchmore participated in portions of today's session under my supervision. The approximate face-to-face time with the genetic counselor was 40 minutes.  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 Fetus could be at risk for hemoglobin D disease, hemoglobin sickle D disease, or hemoglobin D/beta-thalassemia  Declined partner carrier screening. Prefers to wait for newborn screening  Discussed additional carrier screening for cystic fibrosis and spinal muscular atrophy  Declined additional carrier screening  Offered additional testing and screening  Declined NIPS and amniocentesis  Reviewed family history concerns   Gershon Crane, MS, Aeronautical engineer

## 2020-08-18 ENCOUNTER — Other Ambulatory Visit: Payer: Self-pay | Admitting: *Deleted

## 2020-08-18 DIAGNOSIS — G40909 Epilepsy, unspecified, not intractable, without status epilepticus: Secondary | ICD-10-CM

## 2020-09-21 ENCOUNTER — Ambulatory Visit: Payer: Medicaid Other

## 2020-09-29 ENCOUNTER — Ambulatory Visit: Payer: Medicaid Other

## 2020-10-06 ENCOUNTER — Ambulatory Visit: Payer: Medicaid Other | Admitting: Neurology

## 2020-10-15 ENCOUNTER — Other Ambulatory Visit: Payer: Self-pay

## 2020-10-15 ENCOUNTER — Other Ambulatory Visit: Payer: Self-pay | Admitting: Obstetrics and Gynecology

## 2020-10-15 ENCOUNTER — Ambulatory Visit: Payer: Medicaid Other | Admitting: *Deleted

## 2020-10-15 ENCOUNTER — Ambulatory Visit: Payer: Medicaid Other | Attending: Obstetrics and Gynecology

## 2020-10-15 VITALS — BP 134/71 | HR 75

## 2020-10-15 DIAGNOSIS — O99353 Diseases of the nervous system complicating pregnancy, third trimester: Secondary | ICD-10-CM | POA: Insufficient documentation

## 2020-10-15 DIAGNOSIS — Z3A39 39 weeks gestation of pregnancy: Secondary | ICD-10-CM

## 2020-10-15 DIAGNOSIS — O99013 Anemia complicating pregnancy, third trimester: Secondary | ICD-10-CM

## 2020-10-15 DIAGNOSIS — G40909 Epilepsy, unspecified, not intractable, without status epilepticus: Secondary | ICD-10-CM

## 2020-10-15 DIAGNOSIS — D649 Anemia, unspecified: Secondary | ICD-10-CM | POA: Diagnosis not present

## 2020-10-18 ENCOUNTER — Inpatient Hospital Stay (EMERGENCY_DEPARTMENT_HOSPITAL)
Admission: AD | Admit: 2020-10-18 | Discharge: 2020-10-19 | Disposition: A | Discharge status: Home / Self Care | Payer: Medicaid Other | Source: Home / Self Care | Attending: Obstetrics and Gynecology | Admitting: Obstetrics and Gynecology

## 2020-10-18 ENCOUNTER — Other Ambulatory Visit: Payer: Self-pay

## 2020-10-18 DIAGNOSIS — Z3A40 40 weeks gestation of pregnancy: Secondary | ICD-10-CM | POA: Insufficient documentation

## 2020-10-18 DIAGNOSIS — O480 Post-term pregnancy: Secondary | ICD-10-CM | POA: Insufficient documentation

## 2020-10-18 DIAGNOSIS — O471 False labor at or after 37 completed weeks of gestation: Secondary | ICD-10-CM | POA: Insufficient documentation

## 2020-10-18 DIAGNOSIS — O479 False labor, unspecified: Secondary | ICD-10-CM

## 2020-10-18 NOTE — Progress Notes (Signed)
Pt came in w/ ctx, due to language barrier unable to assess when they started. RN obtained Interpreter via [place] phone call but declined a female interpreter. Pt and pt husband has a medical female interpreter who was on the phone, Georgette Dover who will be available all night to interpret [phone]. Pt placed on EFM and cervix checked with permission. Via interpreter, pt voiced understanding she will be reassessed in an hour for labor w/ a cervix recheck.

## 2020-10-18 NOTE — Progress Notes (Signed)
Pt declined female interpreter via [place] and language line. Pt has own medical translator who was on the phone interpreting. She is unavailable to interpret in person until tomorrow morning. Pt and husband only want her to interpret    Interpreter: Georgette Dover [phone]

## 2020-10-19 ENCOUNTER — Inpatient Hospital Stay (HOSPITAL_COMMUNITY)
Admission: AD | Admit: 2020-10-19 | Discharge: 2020-10-21 | DRG: 805 | Disposition: A | Discharge status: Home / Self Care | Payer: Medicaid Other | Attending: Obstetrics and Gynecology | Admitting: Obstetrics and Gynecology

## 2020-10-19 ENCOUNTER — Encounter (HOSPITAL_COMMUNITY): Payer: Self-pay | Admitting: Obstetrics and Gynecology

## 2020-10-19 DIAGNOSIS — U071 COVID-19: Secondary | ICD-10-CM | POA: Diagnosis present

## 2020-10-19 DIAGNOSIS — Z3A40 40 weeks gestation of pregnancy: Secondary | ICD-10-CM | POA: Diagnosis not present

## 2020-10-19 DIAGNOSIS — O471 False labor at or after 37 completed weeks of gestation: Secondary | ICD-10-CM

## 2020-10-19 DIAGNOSIS — R569 Unspecified convulsions: Secondary | ICD-10-CM

## 2020-10-19 DIAGNOSIS — O9852 Other viral diseases complicating childbirth: Secondary | ICD-10-CM | POA: Diagnosis present

## 2020-10-19 DIAGNOSIS — O4202 Full-term premature rupture of membranes, onset of labor within 24 hours of rupture: Secondary | ICD-10-CM

## 2020-10-19 DIAGNOSIS — D582 Other hemoglobinopathies: Secondary | ICD-10-CM

## 2020-10-19 DIAGNOSIS — Z3400 Encounter for supervision of normal first pregnancy, unspecified trimester: Secondary | ICD-10-CM

## 2020-10-19 DIAGNOSIS — R030 Elevated blood-pressure reading, without diagnosis of hypertension: Secondary | ICD-10-CM | POA: Diagnosis present

## 2020-10-19 DIAGNOSIS — O0930 Supervision of pregnancy with insufficient antenatal care, unspecified trimester: Secondary | ICD-10-CM

## 2020-10-19 DIAGNOSIS — Z148 Genetic carrier of other disease: Secondary | ICD-10-CM

## 2020-10-19 DIAGNOSIS — G40909 Epilepsy, unspecified, not intractable, without status epilepticus: Secondary | ICD-10-CM | POA: Diagnosis present

## 2020-10-19 DIAGNOSIS — O99354 Diseases of the nervous system complicating childbirth: Secondary | ICD-10-CM | POA: Diagnosis present

## 2020-10-19 DIAGNOSIS — Z0289 Encounter for other administrative examinations: Secondary | ICD-10-CM

## 2020-10-19 DIAGNOSIS — O26893 Other specified pregnancy related conditions, third trimester: Secondary | ICD-10-CM | POA: Diagnosis present

## 2020-10-19 DIAGNOSIS — O479 False labor, unspecified: Secondary | ICD-10-CM | POA: Insufficient documentation

## 2020-10-19 LAB — RPR: RPR Ser Ql: NONREACTIVE

## 2020-10-19 LAB — GROUP B STREP BY PCR: Group B strep by PCR: NEGATIVE

## 2020-10-19 LAB — TYPE AND SCREEN
ABO/RH(D): B POS
Antibody Screen: NEGATIVE

## 2020-10-19 LAB — RESP PANEL BY RT-PCR (FLU A&B, COVID) ARPGX2
Influenza A by PCR: NEGATIVE
Influenza B by PCR: NEGATIVE
SARS Coronavirus 2 by RT PCR: POSITIVE — AB

## 2020-10-19 MED ORDER — ZOLPIDEM TARTRATE 5 MG PO TABS: 5.0000 mg | ORAL_TABLET | Freq: Every evening | ORAL | Status: DC | PRN

## 2020-10-19 MED ORDER — MEASLES, MUMPS & RUBELLA VAC IJ SOLR
0.5000 mL | Freq: Once | INTRAMUSCULAR | Status: DC
Start: 2020-10-20 — End: 2020-10-21

## 2020-10-19 MED ORDER — FENTANYL CITRATE (PF) 100 MCG/2ML IJ SOLN
50.0000 ug | INTRAMUSCULAR | Status: DC | PRN
  Administered 2020-10-19: 100 ug via INTRAVENOUS
  Administered 2020-10-19 (×2): 50 ug via INTRAVENOUS
  Administered 2020-10-19 (×3): 100 ug via INTRAVENOUS
  Filled 2020-10-19 (×6): qty 2

## 2020-10-19 MED ORDER — OXYCODONE-ACETAMINOPHEN 5-325 MG PO TABS: 1.0000 | ORAL_TABLET | ORAL | Status: DC | PRN

## 2020-10-19 MED ORDER — ONDANSETRON HCL 4 MG/2ML IJ SOLN: 4.0000 mg | Freq: Four times a day (QID) | INTRAMUSCULAR | Status: DC | PRN

## 2020-10-19 MED ORDER — PRENATAL MULTIVITAMIN CH
1.0000 | ORAL_TABLET | Freq: Every day | ORAL | Status: DC
  Administered 2020-10-20 – 2020-10-21 (×2): 1 via ORAL
  Filled 2020-10-19 (×2): qty 1

## 2020-10-19 MED ORDER — OXYCODONE-ACETAMINOPHEN 5-325 MG PO TABS: 2.0000 | ORAL_TABLET | ORAL | Status: DC | PRN

## 2020-10-19 MED ORDER — SODIUM CHLORIDE 0.9% FLUSH
3.0000 mL | Freq: Two times a day (BID) | INTRAVENOUS | Status: DC
  Administered 2020-10-19: 3 mL via INTRAVENOUS

## 2020-10-19 MED ORDER — COCONUT OIL OIL: 1.0000 "application " | TOPICAL_OIL | Status: DC | PRN

## 2020-10-19 MED ORDER — ONDANSETRON HCL 4 MG/2ML IJ SOLN: 4.0000 mg | INTRAMUSCULAR | Status: DC | PRN

## 2020-10-19 MED ORDER — SODIUM CHLORIDE 0.9 % IV SOLN: 250.0000 mL | INTRAVENOUS | Status: DC | PRN

## 2020-10-19 MED ORDER — LACTATED RINGERS IV SOLN
500.0000 mL | INTRAVENOUS | Status: DC | PRN
Start: 2020-10-19 — End: 2020-10-19

## 2020-10-19 MED ORDER — DIPHENHYDRAMINE HCL 25 MG PO CAPS: 25.0000 mg | ORAL_CAPSULE | Freq: Four times a day (QID) | ORAL | Status: DC | PRN

## 2020-10-19 MED ORDER — SOD CITRATE-CITRIC ACID 500-334 MG/5ML PO SOLN: 30.0000 mL | ORAL | Status: DC | PRN

## 2020-10-19 MED ORDER — SIMETHICONE 80 MG PO CHEW: 80.0000 mg | CHEWABLE_TABLET | ORAL | Status: DC | PRN

## 2020-10-19 MED ORDER — DIBUCAINE (PERIANAL) 1 % EX OINT: 1.0000 "application " | TOPICAL_OINTMENT | CUTANEOUS | Status: DC | PRN

## 2020-10-19 MED ORDER — ACETAMINOPHEN 325 MG PO TABS: 650.0000 mg | ORAL_TABLET | ORAL | Status: DC | PRN

## 2020-10-19 MED ORDER — SODIUM CHLORIDE 0.9% FLUSH: 3.0000 mL | INTRAVENOUS | Status: DC | PRN

## 2020-10-19 MED ORDER — IBUPROFEN 600 MG PO TABS
600.0000 mg | ORAL_TABLET | Freq: Four times a day (QID) | ORAL | Status: DC
  Administered 2020-10-19 – 2020-10-21 (×7): 600 mg via ORAL
  Filled 2020-10-19 (×7): qty 1

## 2020-10-19 MED ORDER — LEVETIRACETAM 250 MG PO TABS
250.0000 mg | ORAL_TABLET | Freq: Two times a day (BID) | ORAL | Status: DC
  Filled 2020-10-19 (×5): qty 1

## 2020-10-19 MED ORDER — OXYTOCIN BOLUS FROM INFUSION
333.0000 mL | Freq: Once | INTRAVENOUS | Status: AC
  Administered 2020-10-19: 333 mL via INTRAVENOUS

## 2020-10-19 MED ORDER — BISACODYL 10 MG RE SUPP: 10.0000 mg | Freq: Every day | RECTAL | Status: DC | PRN

## 2020-10-19 MED ORDER — SENNOSIDES-DOCUSATE SODIUM 8.6-50 MG PO TABS
2.0000 | ORAL_TABLET | Freq: Every day | ORAL | Status: DC
  Administered 2020-10-21: 2 via ORAL
  Filled 2020-10-19 (×2): qty 2

## 2020-10-19 MED ORDER — OXYTOCIN-SODIUM CHLORIDE 30-0.9 UT/500ML-% IV SOLN
2.5000 [IU]/h | INTRAVENOUS | Status: DC
  Administered 2020-10-19: 2.5 [IU]/h via INTRAVENOUS
  Filled 2020-10-19: qty 500

## 2020-10-19 MED ORDER — LEVETIRACETAM 250 MG PO TABS
250.0000 mg | ORAL_TABLET | Freq: Two times a day (BID) | ORAL | Status: DC
  Filled 2020-10-19 (×2): qty 1

## 2020-10-19 MED ORDER — WITCH HAZEL-GLYCERIN EX PADS: 1.0000 "application " | MEDICATED_PAD | CUTANEOUS | Status: DC | PRN

## 2020-10-19 MED ORDER — ONDANSETRON HCL 4 MG PO TABS: 4.0000 mg | ORAL_TABLET | ORAL | Status: DC | PRN

## 2020-10-19 MED ORDER — FLEET ENEMA 7-19 GM/118ML RE ENEM: 1.0000 | ENEMA | Freq: Every day | RECTAL | Status: DC | PRN

## 2020-10-19 MED ORDER — TETANUS-DIPHTH-ACELL PERTUSSIS 5-2.5-18.5 LF-MCG/0.5 IM SUSY: 0.5000 mL | PREFILLED_SYRINGE | Freq: Once | INTRAMUSCULAR | Status: DC

## 2020-10-19 MED ORDER — LACTATED RINGERS IV SOLN
INTRAVENOUS | Status: DC
Start: 2020-10-19 — End: 2020-10-19

## 2020-10-19 MED ORDER — BENZOCAINE-MENTHOL 20-0.5 % EX AERO: 1.0000 "application " | INHALATION_SPRAY | CUTANEOUS | Status: DC | PRN

## 2020-10-19 MED ORDER — LIDOCAINE HCL (PF) 1 % IJ SOLN: 30.0000 mL | INTRAMUSCULAR | Status: DC | PRN

## 2020-10-19 NOTE — H&P (Signed)
OB ADMISSION/ HISTORY & PHYSICAL:  Admission Date: 10/19/2020  3:09 AM  Admit Diagnosis: labor check    In person interpreter used "Shekeela."    Gabrielle Wilson is a 20 y.o. female presenting for SOL at [redacted]w[redacted]d. She is grimacing with contractions. She denies HA/BV/N/V and symptoms of Preeclampsia.  Husband at beside and supportive. Desires IV fentanyl for pain management.   Prenatal History: G1P0000   EDC : 10/15/2020, by Ultrasound  Prenatal care at [facility] and transferred to [facility] since [redacted] wks gestation.    Prenatal course complicated by: Inadequate PNC, d/t transportation issues last visit in Jan.  Hbg D- [place] Hx of Seizure disorder-off Keppra and stable   Prenatal Labs: ABO, Rh: B (12/16 1212)  Antibody: Negative (12/16 1212) Rubella: 2.60 (12/16 1212)  RPR: Non Reactive (12/16 1212)  HBsAg: Negative (12/16 1212)  HIV: Non Reactive (12/16 1212)  GBS:   unknown  1 hr Glucola : Normal; Early A1c normal   Genetic Screening: Declined  Ultrasound: Last [place] at [redacted]w[redacted]d. Cephalic, EFW 7#14oz. AFI normal, Placenta left fundal   Vaccines: TDaP          UTD         Flu             UTD                    COVID-19 UTD    Maternal Diabetes: No Genetic Screening: Declined Maternal Ultrasounds/Referrals: Normal Fetal Ultrasounds or other Referrals:  None Maternal Substance Abuse:  No Significant Maternal Medications:  None   Significant Maternal Lab Results:  Other:   GBS unkown  Other Comments:  None  Medical / Surgical History :  Past medical history:  Past Medical History:  Diagnosis Date  . Seizure [facility])      Past surgical history: History reviewed. No pertinent surgical history.   Family History: No family history on file.   Social History:  reports that she has never smoked. She has never used smokeless tobacco. She reports previous alcohol use. She reports previous drug use.   Allergies: Patient has no known allergies.   Current Medications at time of admission:   Medications Prior to Admission  Medication Sig Dispense Refill Last Dose  . acetaminophen (TYLENOL) 500 MG tablet Take 500 mg by mouth every 6 (six) hours as needed.     . levETIRAcetam (KEPPRA) 500 MG tablet Take 1/2 tablet in morning, 1/2 tablet in evening for 1 week, then increase to 1 tablet in morning, 1 tablet in evening 60 tablet 11   . Prenatal Vit-Fe Fumarate-FA (MULTIVITAMIN-PRENATAL) 27-0.8 MG TABS tablet Take 1 tablet by mouth daily.        Review of Systems: Review of Systems  Constitutional: Negative for chills and fever.  HENT: Positive for congestion. Negative for sinus pain and sore throat.   Eyes: Negative for blurred vision and double vision.  Respiratory: Negative for cough, sputum production, shortness of breath, wheezing and stridor.   Cardiovascular: Negative for chest pain and palpitations.  Gastrointestinal: Positive for abdominal pain. Negative for heartburn, nausea and vomiting.  Musculoskeletal: Negative for falls.  Neurological: Positive for seizures. Negative for dizziness, tingling, loss of consciousness, weakness and headaches.  Psychiatric/Behavioral: Negative for depression, hallucinations, substance abuse and suicidal ideas. The patient is not nervous/anxious.     Physical Exam: Vital signs and nursing notes reviewed.  Patient Vitals for the past 24 hrs:  BP Temp Temp src Pulse Resp SpO2  10/19/20 0333 137/81 98 F (36.7 C) Oral (!) 104 18 100 %     General: AAO x 3, NAD, coping well.  Heart: RRR Lungs:CTAB Abdomen: Gravid, NT, Leopold's vertex Extremities: no edema Genitalia / VE: Dilation: 3 Effacement (%): 80 Station: -2 Presentation: Vertex Exam by:: M.Aube RN   FHR: 130 BPM, moerate variability, present accels, absent decels TOCO: Ctx q 1-5  Labs:   Pending T&S, CBC, RPR  No results for input(s): WBC, HGB, HCT, PLT in the last 72 hours.   Assessment:  20 y.o. G1P0000 at [redacted]w[redacted]d Inadequate PNC, d/t transportation issues last  visit in Jan.  Hbg D- [place] Hx of Seizure disorder-stable off meds    1. Early SOL 2. FHR category I 3. GBS Unknown  4. Desires no FEMALE providers, and prefers to labor without an epidural.  5. Breastfeeding 6. Placenta disposal L&D   Plan:  1. Admit to BS 2. Routine L&D orders  -GBS Unknown. PCR collected and pending.   -Plan for Risk-based treatment  3. Analgesia/anesthesia PRN  -IV fentanyl PRN for pain management  4. Expectant management  -If no cervical change in 4 hours plan to start pitocin 2x2.  5. Anticipate NSVB    Zadrian Mccauley Danella Deis) Suzie Portela, BSN, RNC-OB  Student Nurse-Midwife   10/19/2020  6:11 AM

## 2020-10-19 NOTE — Discharge Instructions (Signed)
Labor and Vaginal Delivery  Vaginal delivery means that you give birth by pushing your baby out of your birth canal (vagina). A team of health care providers will help you before, during, and after vaginal delivery. Birth experiences are unique for every woman and every pregnancy, and birth experiences vary depending on where you choose to give birth. What happens when I arrive at the birth center or hospital? Once you are in labor and have been admitted into the hospital or birth center, your health care provider may:  Review your pregnancy history and any concerns that you have.  Insert an IV into one of your veins. This may be used to give you fluids and medicines.  Check your blood pressure, pulse, temperature, and heart rate (vital signs).  Check whether your bag of water (amniotic sac) has broken (ruptured).  Talk with you about your birth plan and discuss pain control options. Monitoring Your health care provider may monitor your contractions (uterine monitoring) and your baby's heart rate (fetal monitoring). You may need to be monitored:  Often, but not continuously (intermittently).  All the time or for long periods at a time (continuously). Continuous monitoring may be needed if: ? You are taking certain medicines, such as medicine to relieve pain or make your contractions stronger. ? You have pregnancy or labor complications. Monitoring may be done by:  Placing a special stethoscope or a handheld monitoring device on your abdomen to check your baby's heartbeat and to check for contractions.  Placing monitors on your abdomen (external monitors) to record your baby's heartbeat and the frequency and length of contractions.  Placing monitors inside your uterus through your vagina (internal monitors) to record your baby's heartbeat and the frequency, length, and strength of your contractions. Depending on the type of monitor, it may remain in your uterus or on your baby's head  until birth.  Telemetry. This is a type of continuous monitoring that can be done with external or internal monitors. Instead of having to stay in bed, you are able to move around during telemetry. Physical exam Your health care provider may perform frequent physical exams. This may include:  Checking how and where your baby is positioned in your uterus.  Checking your cervix to determine: ? Whether it is thinning out (effacing). ? Whether it is opening up (dilating). What happens during labor and delivery? Normal labor and delivery is divided into the following three stages: Stage 1  This is the longest stage of labor.  This stage can last for hours or days.  Throughout this stage, you will feel contractions. Contractions generally feel mild, infrequent, and irregular at first. They get stronger, more frequent (about every 2-3 minutes), and more regular as you move through this stage.  This stage ends when your cervix is completely dilated to 4 inches (10 cm) and completely effaced. Stage 2  This stage starts once your cervix is completely effaced and dilated and lasts until the delivery of your baby.  This stage may last from 20 minutes to 2 hours.  This is the stage where you will feel an urge to push your baby out of your vagina.  You may feel stretching and burning pain, especially when the widest part of your baby's head passes through the vaginal opening (crowning).  Once your baby is delivered, the umbilical cord will be clamped and cut. This usually occurs after waiting a period of 1-2 minutes after delivery.  Your baby will be placed on your bare  chest (skin-to-skin contact) in an upright position and covered with a warm blanket. Watch your baby for feeding cues, like rooting or sucking, and help the baby to your breast for his or her first feeding. Stage 3  This stage starts immediately after the birth of your baby and ends after you deliver the placenta.  This stage  may take anywhere from 5 to 30 minutes.  After your baby has been delivered, you will feel contractions as your body expels the placenta and your uterus contracts to control bleeding.   What can I expect after labor and delivery?  After labor is over, you and your baby will be monitored closely until you are ready to go home to ensure that you are both healthy. Your health care team will teach you how to care for yourself and your baby.  You and your baby will stay in the same room (rooming in) during your hospital stay. This will encourage early bonding and successful breastfeeding.  You may continue to receive fluids and medicines through an IV.  Your uterus will be checked and massaged regularly (fundal massage).  You will have some soreness and pain in your abdomen, vagina, and the area of skin between your vaginal opening and your anus (perineum).  If an incision was made near your vagina (episiotomy) or if you had some vaginal tearing during delivery, cold compresses may be placed on your episiotomy or your tear. This helps to reduce pain and swelling.  You may be given a squirt bottle to use instead of wiping when you go to the bathroom. To use the squirt bottle, follow these steps: ? Before you urinate, fill the squirt bottle with warm water. Do not use hot water. ? After you urinate, while you are sitting on the toilet, use the squirt bottle to rinse the area around your urethra and vaginal opening. This rinses away any urine and blood. ? Fill the squirt bottle with clean water every time you use the bathroom.  It is normal to have vaginal bleeding after delivery. Wear a sanitary pad for vaginal bleeding and discharge. Summary  Vaginal delivery means that you will give birth by pushing your baby out of your birth canal (vagina).  Your health care provider may monitor your contractions (uterine monitoring) and your baby's heart rate (fetal monitoring).  Your health care provider  may perform a physical exam.  Normal labor and delivery is divided into three stages.  After labor is over, you and your baby will be monitored closely until you are ready to go home. This information is not intended to replace advice given to you by your health care provider. Make sure you discuss any questions you have with your health care provider. Document Revised: 06/13/2017 Document Reviewed: 06/13/2017 Elsevier Patient Education  2021 [employer].

## 2020-10-19 NOTE — Progress Notes (Signed)
Medical interpreter at the bedside for interpretation. Discussed through interpreter that provider has ordered Keppra for seizure history. State they do not need it because "they feel she is fine and does not need it." States that  She had a seizure 6 months ago and stopped taking medication because pt feels the seizures were just anxiety.   Will discuss with provider.

## 2020-10-19 NOTE — MAU Provider Note (Signed)
S: Ms. Sahory Nordling is a 20 y.o. G1P0000 at [redacted]w[redacted]d  who presents to MAU today for labor evaluation.     Cervical exam by RN:  Dilation: 1.5 Effacement (%): 50 Cervical Position: Posterior Station: -3 Exam by:: Lestine Box, RN No change after one hour of monitoring   Fetal Monitoring: Baseline: 130 Variability: average Accelerations: present Decelerations: Absent Contractions: uterine irritability, most contractions 20-30 seconds each  MDM Discussed patient with RN. NST reviewed. Offered to wait another hour and recheck, pt declined   A: SIUP at [redacted]w[redacted]d  False labor  P: Discharge home Labor precautions and kick counts included in AVS Patient to follow-up with Office as scheduled  Patient may return to MAU as needed or when in labor   Aviva Signs, [place] 10/19/2020 12:11 AM

## 2020-10-19 NOTE — MAU Note (Signed)
Pt is G1P0 at 40.4weeks with complaints of increasing painful contractions. Pt denies SROM, vaginal bleeding or bloody show.

## 2020-10-19 NOTE — Lactation Note (Signed)
Lactation Consultation Note  Patient Name: Gabrielle Wilson EPPIR'J Date: 10/19/2020 Reason for consult: L&D Initial assessment;Term;Primapara;1st time breastfeeding Age:20 y.o.   Initial LC Consult: L&D  Visited with family < 1 hours after birth Mother is Covid +  Parents are [place] refugees and interpreter has been present.  At this time, the interpreter is not available.  L&D greaseboard did not show a lactation consult desire: called RN and she verified mother wanted a consult.  However, when I arrived father stated, "She's not ready."  Mother's non verbal communication demonstrated this.  Baby was swaddled and asleep in father's arms.  Told father lactation services will help on the M/B unit (he seemed to understand this).  Allowed time for family bonding.     Maternal Data    Feeding Mother's Current Feeding Choice: Breast Milk  LATCH Score                    Lactation Tools Discussed/Used    Interventions    Discharge    Consult Status Consult Status: Follow-up Date: 10/19/20 Follow-up type: In-patient    Dora Sims 10/19/2020, 2:34 PM

## 2020-10-19 NOTE — Discharge Summary (Signed)
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 yo G1P0000 at [redacted]w[redacted]d was admitted in Latent Labor on 10/19/2020. Patient had an uncomplicated labor course as follows:  Membrane Rupture Time/Date: 11:34 AM ,10/19/2020   Delivery Method:Vaginal, Spontaneous  Episiotomy: None  Lacerations:  Periurethral  Patient had an uncomplicated postpartum course.  She is ambulating, tolerating a regular diet, passing flatus, and urinating well. Patient is discharged home in stable condition on 10/21/20.  Newborn Data: Birth date:10/19/2020  Birth time:1:52 PM  Gender:Female  Living status:Living  Apgars:8 ,9  Weight:3374 g   Magnesium Sulfate received: No BMZ received: No Rhophylac:N/A MMR:N/A T-DaP:received 1st trimester (refugee) Flu: received prenatally Transfusion:No  Physical exam  Vitals:   10/20/20 0549 10/20/20 1339 10/20/20 2000 10/21/20 0518  BP: 114/77 114/68 (!) 110/62 (!) 112/58  Pulse: 95 82 80 72  Resp: $Remo'20 20 18 18  'UOltB$ Temp: 97.9 F (36.6 C) 97.8 F (36.6 C) 98 F (36.7 C) 97.6 F (36.4 C)  TempSrc: Oral  Oral   SpO2: 100% 99%     General: alert, cooperative and no distress Lochia: appropriate Uterine Fundus: firm Incision:  N/A DVT Evaluation: No evidence of DVT seen on physical exam. No cords or calf tenderness. No significant calf/ankle edema. Labs: Lab Results  Component Value Date   WBC 7.8 10/19/2020   HGB 12.7 10/19/2020   HCT 37.3 10/19/2020   MCV 86.3 10/19/2020   PLT 198 10/19/2020   No flowsheet data found. Edinburgh Score: No flowsheet data found.   After visit meds:  Allergies as of 10/21/2020   No Known Allergies     Medication List    TAKE these medications   acetaminophen 325 MG tablet Commonly known as: Tylenol Take 2 tablets (650 mg total) by mouth every 6 (six) hours as needed for mild pain, moderate pain, fever or headache (for pain scale < 4). What changed:   medication strength  how much to take  reasons to take this   coconut oil Oil Apply 1 application topically as needed (nipple pain).   ferrous sulfate 325 (65 FE) MG tablet Take 1 tablet (325 mg total) by mouth every other day.   ibuprofen 600 MG tablet Commonly known as: ADVIL Take 1 tablet (600 mg total) by mouth every 8 (eight) hours as needed for moderate pain or cramping.   levETIRAcetam 500 MG tablet Commonly known as: KEPPRA Take 1/2 tablet in morning, 1/2 tablet in evening for 1 week, then increase to 1 tablet in morning, 1 tablet in evening   multivitamin-prenatal 27-0.8 MG Tabs tablet Take  1 tablet by mouth daily.        Discharge home in stable condition Infant Feeding: Breast Infant Disposition:home with mother Discharge instruction: per After Visit Summary and Postpartum booklet. Activity: Advance as tolerated. Pelvic rest for 6 weeks.  Diet: routine diet   Future Appointments: Future Appointments  Date Time Provider [place]  12/03/2020 10:55 AM Anyanwu, Sallyanne Havers, MD [facility]   Follow up Visit:       Roma Schanz, CNM  P [facility] Admin Pool Please schedule this patient for PP visit in: 4-6  ?High risk pregnancy complicated by: ?possible seizure disorder   Delivery mode: SVD  Anticipated Birth Control: Condoms  PP Procedures needed: none  Schedule Integrated BH visit: no  Provider: Any provider (FEMALE ONLY)   Laury Deep, CNM OB Fellow, Faculty Practice 10/21/2020 1:54 PM

## 2020-10-19 NOTE — Progress Notes (Addendum)
Patient ID: Gabrielle Wilson, female   DOB: Feb 06, 2001, 20 y.o.   MRN: [id number] Gabrielle Wilson is a 20 y.o. G1P0000 at [redacted]w[redacted]d admitted for early labor  Subjective: Uncomfortable w/ uc's, but able to sleep in between, has received 3 doses of IV fentanyl  Objective: BP (!) 141/75   Pulse (!) 112   Temp 97.9 F (36.6 C)   Resp 18   LMP 01/13/2020 (Approximate)   SpO2 100%  No intake/output data recorded.  FHR baseline 125 bpm, Variability: moderate, Accelerations:present, Decelerations:  Absent Toco: q 3-72mins   SVE:   Dilation: 6 Effacement (%): 80 Station: 0 Exam by:: K Gleen Ripberger CNM Bloody show  Labs: Lab Results  Component Value Date   WBC 7.8 10/19/2020   HGB 12.7 10/19/2020   HCT 37.3 10/19/2020   MCV 86.3 10/19/2020   PLT 198 10/19/2020    Assessment / Plan: Spontaneous labor, progressing normally. 1st elevated bp, asymptomatic, will monitor.   Labor: active Fetal Wellbeing:  Category I Pain Control:  IV pain meds Pre-eclampsia: N/A I/D:  GBS pcr pending Anticipated MOD: NSVB  Cheral Marker CNM, WHNP-BC 10/19/2020, 9:17 AM

## 2020-10-19 NOTE — Progress Notes (Signed)
Pt interpreter Georgette Dover called to interpret. Pt cervix rechecked per pt permission and unchanged. Pt requests to go home instead of staying another hour for a labor reevaluation. Wynelle Bourgeois, CNM notified of labor evaluation and orders pt discharge home. RN discussed with pt and husband when to return to MAU or call 911 (increased ctx/pain, water breaks, baby not moving as much) via interpreter and pt and husband verbally understood. RN reinforced importance of attending prenatal visit 5/31. Pt and husband verbally understood via interpreter. Interpreter Shakela told RN she is going with them to interpret at appt Tues.

## 2020-10-19 NOTE — Progress Notes (Signed)
Interpreter Shakeela used over the phone for admission to [facility].  [facility] interpreter unavailable, in person interpreter has left the hospital for the day, will return tomorrow at 1pm, is available @ [phone] in the event of an emergency.

## 2020-10-19 NOTE — MAU Note (Signed)
Medical interpreter Sonny Dandy with [employer]-([phone]) used for interpretation-she has been with pt and husband with many of her appointments and will arrive at 0600 to assist with interpretation -arranged with Dr.Shankar.  Through interpreter pt states when asked about seizure disorder-pt states it was a single episode in camp in [place] was placed on Keppra that she stopped taking 6 months ago-pt feels seizure was anxiety related.  Pt desires IV or po pain medication in labor.  Pt and husband are refugees from [place]  Without a sponsor and do not have supplies or car seat for baby.

## 2020-10-19 NOTE — Progress Notes (Signed)
Patient ID: Gabrielle Wilson, female   DOB: 2001-03-22, 20 y.o.   MRN: [id number] Gabrielle Wilson is a 20 y.o. G1P0000 at [redacted]w[redacted]d admitted for early labor  Subjective: uncomfortable w/ contractions and requesting pain meds, wants water broken  Objective: BP 129/81   Pulse 99   Temp 97.9 F (36.6 C)   Resp 16   LMP 01/13/2020 (Approximate)   SpO2 100%  No intake/output data recorded.  FHR baseline 125 bpm, Variability: moderate, Accelerations:present, Decelerations:  Absent Toco: q 2-63mins   SVE:  7/80/0 AROM Lt MSF Pitocin @ 0 mu/min  Labs: Lab Results  Component Value Date   WBC 7.8 10/19/2020   HGB 12.7 10/19/2020   HCT 37.3 10/19/2020   MCV 86.3 10/19/2020   PLT 198 10/19/2020    Assessment / Plan: Spontaneous labor, progressing normally, now AROM'd  Labor: active Fetal Wellbeing:  Category I Pain Control:  IV pain meds Pre-eclampsia: N/A I/D:  GBS PCR neg Anticipated MOD: NSVB  Cheral Marker CNM, WHNP-BC 10/19/2020, 11:39 AM

## 2020-10-20 ENCOUNTER — Encounter: Payer: Medicaid Other | Admitting: Obstetrics and Gynecology

## 2020-10-20 NOTE — Lactation Note (Addendum)
This note was copied from a baby's chart. Lactation Consultation Note  Patient Name: Gabrielle Wilson Date: 10/20/2020 Reason for consult: Follow-up assessment;1st time breastfeeding;Term;Infant weight loss (-2% weight loss, mom's left breast is invert and her right breast is short shafted, mom not latched infant on left breast mostly feeding infant from her right breast, per mom having some pain with latch, LC did not observe any nipple trauma.) Age:21 hours, 3 voids  And 2 stools today. Interpreter used on [name]  [phone] Parents speak : "Pastho" LC entered the room, infant was cuing to breastfeed, mom wanted help with latching infant on her left breast which is inverted. Mom's mostly been latching infant on her right breast which is short shafted and infant been given formula once. LC assisted mom with using hand pump with 24 mm breast flanged, mom pre-pump breast prior to latching infant. When mom-pre-pumped breast her nipple everted completely outward, mom latched infant using the football hold position, infant latched with depth and sustained latch, breastfeeding for 15 minutes while LC was in the room. Per mom, she is not feeling any pain with latch and was glad, LC discussed with mom to latch infant on both breast during a feeding. Dad was shown how to assist mom with pre-pumping breast prior to latching infant. Mom will continue to breastfeed infant according to hunger cues, 8 to 12+ times within 24 hours, STS. LC gave mom comfort gels to use after latching infant at the breast.  Mom knows to call RN or LC if she has any further questions, concerns or needs further assistance with latching infant at the breast. Mom made aware of O/P services, breastfeeding support groups, community resources, and our phone # for post-discharge questions.   Maternal Data Has patient been taught Hand Expression?: Yes Does the patient have breastfeeding experience prior to this  delivery?: No  Feeding Mother's Current Feeding Choice: Breast Milk and Formula  LATCH Score Latch: Grasps breast easily, tongue down, lips flanged, rhythmical sucking.  Audible Swallowing: Spontaneous and intermittent  Type of Nipple: Inverted  Comfort (Breast/Nipple): Soft / non-tender  Hold (Positioning): Assistance needed to correctly position infant at breast and maintain latch.  LATCH Score: 7   Lactation Tools Discussed/Used Tools: Pump Breast pump type: Manual Pump Education: Setup, frequency, and cleaning;Milk Storage Reason for Pumping: pre-pump breast prior to latching infant due to mom left breast being inverted and her right breast short shafted. Pumping frequency: pre-pump  and at home prn.  Interventions Interventions: Breast feeding basics reviewed;Assisted with latch;Pre-pump if needed;Adjust position;Support pillows;Position options;Expressed milk;Breast compression;Education;Hand pump;Comfort gels  Discharge Pump: Manual  Consult Status Consult Status: Follow-up Date: 10/21/20 Follow-up type: In-patient    Danelle Earthly 10/20/2020, 7:54 PM

## 2020-10-20 NOTE — Lactation Note (Signed)
This note was copied from a baby's chart. Lactation Consultation Note  Patient Name: Gabrielle Wilson PXTGG'Y Date: 10/20/2020   Age:20 hours  Lactation visit attempted. Previous charting had noted that the Pashto interpreter would be here between 1-2p, but she did not come. While in the room, I called the interpreter's cell #, but she did not answer. The FOB also called the interpreter twice, but without success.   I did try to use the mobile interpreting device, but it was unable to make a Pashto interpreter available.   Infant was sleeping in bassinet while I was in the room. Dad did comment that Mom has pain with breastfeeding. I communicated that with the RN.    [name] [facility] 10/20/2020, 3:02 PM

## 2020-10-20 NOTE — Social Work (Addendum)
1:07 CSW spoke with interpreter Shakeela and confirmed assessment can be completed at 3p.   3:05 CSW unsuccessfully contacted interpreter Shakeela to complete assessment. CSW will follow-up to complete assessment.   CSW received call from interpreter. Informed assessment can be completed tomorrow at 10:30am.  Manfred Arch, MSW, LCSWA Clinical Social Work [employer] and [employer] [phone]

## 2020-10-20 NOTE — Progress Notes (Signed)
POSTPARTUM PROGRESS NOTE  Subjective: Gabrielle Wilson is a 20 y.o. G1P1001 s/p vaginal delivery at [redacted]w[redacted]d.  She reports she doing well. No acute events overnight. She denies any problems with ambulating, voiding or po intake. Denies nausea or vomiting. She has passed flatus. Pain is well controlled.  Lochia is minimal.  Objective: Blood pressure 114/77, pulse 95, temperature 97.9 F (36.6 C), temperature source Oral, resp. rate 20, last menstrual period 01/13/2020, SpO2 100 %, unknown if currently breastfeeding.  Physical Exam:  General: alert, cooperative and no distress Chest: no respiratory distress Abdomen: soft, non-tender  Uterine Fundus: firm and at level of umbilicus Extremities: No calf swelling or tenderness  no LE edema  Recent Labs    10/19/20 0427  HGB 12.7  HCT 37.3    Assessment/Plan: Gabrielle Wilson is a 20 y.o. G1P1001 s/p vaginal delivery at [redacted]w[redacted]d.   Routine Postpartum Care: Doing well, pain well-controlled.  -- Continue routine care, lactation support  -- Contraception: condoms -- Feeding: breast -- Refugee Status: plan for SW consult today -- Seizures: last seizure approximately 6 months ago. Seen by Neurology in 06/2020, at which time keppra was started. Keppra is ordered but pt has been refusing in the postpartum period. Plan to discuss with pt later today.  Dispo: Plan for discharge PPD#2.  Sheila Oats, MD OB Fellow, [facility] 10/20/2020 9:03 AM

## 2020-10-20 NOTE — Progress Notes (Signed)
Interpreter Shakeela on the phone during assessment. RN tried to contact Dr Homero Fellers, who called into the patient's room earlier but RN was unable to reach. RN notified Janene Harvey that Dr Homero Fellers told this RN that he was trying to use interpreter on a 3 way call to speak with patient.

## 2020-10-21 ENCOUNTER — Other Ambulatory Visit (HOSPITAL_COMMUNITY): Payer: Self-pay

## 2020-10-21 DIAGNOSIS — O0930 Supervision of pregnancy with insufficient antenatal care, unspecified trimester: Secondary | ICD-10-CM

## 2020-10-21 MED ORDER — COCONUT OIL OIL: 1.0000 "application " | TOPICAL_OIL | 0 refills | Status: DC | PRN

## 2020-10-21 MED ORDER — FERROUS SULFATE 325 (65 FE) MG PO TABS
325.0000 mg | ORAL_TABLET | ORAL | 2 refills | Status: DC
  Filled 2020-10-21: qty 30, 60d supply, fill #0

## 2020-10-21 MED ORDER — FERROUS SULFATE 325 (65 FE) MG PO TABS
325.0000 mg | ORAL_TABLET | ORAL | Status: DC
  Administered 2020-10-21: 325 mg via ORAL
  Filled 2020-10-21: qty 1

## 2020-10-21 MED ORDER — IBUPROFEN 600 MG PO TABS
600.0000 mg | ORAL_TABLET | Freq: Three times a day (TID) | ORAL | 0 refills | Status: DC | PRN
  Filled 2020-10-21: qty 30, 10d supply, fill #0

## 2020-10-21 MED ORDER — ACETAMINOPHEN 325 MG PO TABS: 650.0000 mg | ORAL_TABLET | Freq: Four times a day (QID) | ORAL | Status: DC | PRN

## 2020-10-21 NOTE — Clinical Social Work Maternal (Signed)
CLINICAL SOCIAL WORK MATERNAL/CHILD NOTE  Patient Details  Name: Gabrielle Wilson MRN: [id number] Date of Birth: 03/20/2005  Date:  10/21/2020  Clinical Social Worker Initiating Note:  Manfred Arch, MSW, LCSWA Date/Time: Initiated:  10/21/20/1020     Child's Name:  Gabrielle Wilson   Biological Parents:  Mother,Father ([name] Dunnigan)   Need for Interpreter:  Other (Comment Required) Rhae Hammock)   Reason for Referral:  Late or No Prenatal Care ,Other (Comment) (Recent immagration from [place])   Address:  [address] [place] [address]    Phone number:  [phone] (home)     Additional phone number:   Household Members/Support Persons (HM/SP):   Household Member/Support Person 1   HM/SP Name Relationship DOB or Age  HM/SP -1 [name] Bryars Spouse 05/24/1999  HM/SP -2        HM/SP -3        HM/SP -4        HM/SP -5        HM/SP -6        HM/SP -7        HM/SP -8          Natural Supports (not living in the home):      Professional Supports: Case Manager/Social Worker   Employment: Full-time   Type of Work: FOB: Environmental health practitioner   Education:      Homebound arranged:    Surveyor, quantity Resources:  Medicaid   Other Resources:  Sales executive ,WIC   Cultural/Religious Considerations Which May Impact Care:    Strengths:  Pediatrician chosen,Home prepared for child    Psychotropic Medications:         Pediatrician:    Armed forces operational officer area  Pediatrician List:   [facility]  [place]    [place]    [facility]      Pediatrician Fax Number:    Risk Factors/Current Problems:  Transportation    Cognitive State:  Alert ,Linear Thinking    Mood/Affect:  Calm    CSW Assessment: CSW consulted for [facility] and refugee family, recently from [place] in need of resources.  CSW contacted MOB by telephone, due to isolation precautions. CSW utilized [employer] interpreter  Janene Harvey [phone] during the assessment. CSW introduced self and role. Both MOB and FOB were present on call. CSW verified DOB and demographic information. CSW informed MOB of reason for consult and assessed current emotions. MOB reported she is currently doing well. MOB stated she did not start receiving prenatal care until they related here in December. CSW was understanding and informed parents of the hospital drug screen policy. MOB aware infant's UDS is negative and the CDS will be followed. CSW informed parents a CPS report will be made if infant test positive for substances. MOB was understanding and denies using any substances during pregnancy.  MOB reported she and FOB reside together. FOB recently started working full-time and they are receiving WIC/food stamp benefits. FOB expressed he does not know how to use WIC in the store. CSW attempted to explain that [facility] items will have a blue tag and provided FOB with a printed picture sheet of WIC items. FOB expressed understanding that additional items such as diapers have to be purchased with money. MOB stated they have no locals supports, aside from their brother-in-law and a Case Manager 'Doha."  Family reported they were not provided a sponsor upon arrival. MOB reported they do not  have transportation services, however CSW notes they have previously been set up with [facility] [employer]. CSW explained to family that they are able to utilize [facility] [employer] for doctor appointments, and the rides can be scheduled by the doctor offices. CSW scheduled transportation to infant's follow-up appointment on 6/2 at 2:30p via [place] with [facility] [employer]. CSW explained to family that the ride will arrive at 2:05p for pickup. FOB expressed understanding.  MOB reported she has no history of mental health and denies any current SI or HI.   CSW provided education regarding the baby blues period versus PPD. CSW encouraged MOB to self evaluate and contact a  medical professional if symptoms are noted at any time.  CSW provided review of Sudden Infant Death Syndrome (SIDS) precautions. Parents were understanding and have been provided with a bassinet by their case manager for safe sleep. Parents also have a car seat and were provided with a baby bundle. CSW spoke with FSN and were able to have additional basic necessities provided. MOB also provided permission to have a referral made to [facility].   CSW will have transportation home provided by [facility] [employer]. CSW identifies no further need for intervention and no barriers to discharge at this time.  CSW Plan/Description:  No Further Intervention Required/No Barriers to Discharge,CSW Will Continue to Monitor Umbilical Cord Tissue Drug Screen Results and Make Report if Warranted,Child Protective Service Report ,Hospital Drug Screen Policy Information,Perinatal Mood and Anxiety Disorder (PMADs) Education,Sudden Infant Death Syndrome (SIDS) Education,Other Information/Referral to Community Resources,Other Patient/Family Education    Adolfo Granieri J Johnette Teigen, LCSWA 10/21/2020, 10:45 AM  

## 2020-10-21 NOTE — Progress Notes (Signed)
Patient ID: Gabrielle Wilson, female   DOB: 03/20/2005, 20 y.o.   MRN: [id number]  TC to [facility]: spoke with Dahlia Client, RN to make sure the office was aware of the mother's (+) COVID-19 result from 10/19/2020 and to see if they would still want to se the infant in person tomorrow @ 1430. Per Dahlia Client, RN who spoke with Dr. Manson Passey the mother and father will need to wear masks during the entire visit and they will be placed ina an exam room  Immediately upon arrival to the office.   Staff RN made aware. Patient cleared for d/c home.  Raelyn Mora, CNM  10/21/2020 2:07 PM

## 2020-10-21 NOTE — Lactation Note (Signed)
This note was copied from a baby's chart. Lactation Consultation Note  Patient Name: Gabrielle Wilson OITGP'Q Date: 10/21/2020   Age:20 hours  Per Sharen Hones, RN, Mom declined a lactation visit. Infant with minimal weight loss at 45 hrs of life.   [name] [facility] 10/21/2020, 11:40 AM

## 2020-10-26 LAB — CBC
HCT: 37.3 % (ref 36.0–46.0)
Hemoglobin: 12.7 g/dL (ref 12.0–15.0)
MCH: 29.4 pg (ref 26.0–34.0)
MCHC: 34 g/dL (ref 30.0–36.0)
MCV: 86.3 fL (ref 80.0–100.0)
Platelets: 198 10*3/uL (ref 150–400)
RBC: 4.32 MIL/uL (ref 3.87–5.11)
RDW: 13 % (ref 11.5–15.5)
WBC: 7.8 10*3/uL (ref 4.0–10.5)
nRBC: 0 % (ref 0.0–0.2)

## 2020-12-03 ENCOUNTER — Ambulatory Visit: Payer: Medicaid Other | Admitting: Obstetrics & Gynecology

## 2021-02-11 ENCOUNTER — Ambulatory Visit (INDEPENDENT_AMBULATORY_CARE_PROVIDER_SITE_OTHER): Payer: Medicaid Other | Admitting: Obstetrics and Gynecology

## 2021-02-11 ENCOUNTER — Other Ambulatory Visit: Payer: Self-pay

## 2021-02-11 DIAGNOSIS — Z3202 Encounter for pregnancy test, result negative: Secondary | ICD-10-CM

## 2021-02-11 LAB — POCT PREGNANCY, URINE: Preg Test, Ur: NEGATIVE

## 2021-02-11 NOTE — Patient Instructions (Signed)
Call your Neurologist [facility] Neurology phone number [phone]) for follow up for your seizure history.

## 2021-02-11 NOTE — Progress Notes (Signed)
    Post Partum Visit Note  Gabrielle Wilson is a 20 y.o. G1P1001 5/30 SVD/left peri-uretheral (not repaired) at  40wks; patient presented in active labor.  Anesthesia: IV sedation. Postpartum course has been uncomplicated. Baby is doing well. Baby is feeding by  breast and formula and she breastfeeds about 7-8 times per day . Bleeding no bleeding or period. Bowel function is normal. Bladder function is normal. Patient is sexually active and no problems or issues. Contraception method is none. Postpartum depression screening: negative.    Edinburgh Postnatal Depression Scale - 02/11/21 1500       Edinburgh Postnatal Depression Scale:  In the Past 7 Days   I have been able to laugh and see the funny side of things. 0    I have looked forward with enjoyment to things. 0    I have blamed myself unnecessarily when things went wrong. 0    I have been anxious or worried for no good reason. 0    I have felt scared or panicky for no good reason. 0    Things have been getting on top of me. 0    I have been so unhappy that I have had difficulty sleeping. 0    I have felt sad or miserable. 0    I have been so unhappy that I have been crying. 0    The thought of harming myself has occurred to me. 0    Edinburgh Postnatal Depression Scale Total 0              Review of Systems Pertinent items noted in HPI and remainder of comprehensive ROS otherwise negative.  Objective:  BP (!) 113/55   Pulse 75   Wt 115 lb 1.6 oz (52.2 kg)   Breastfeeding Yes   BMI 19.15 kg/m   NAD  Assessment:   Patient stable Plan:   *PP: d/w pt and nothing for birth control. I told them that just because she hasn't had a period doesn't mean that she couldn't get pregnant, and I told them that it's normal to not get a period yet with her breastfeeding so often. Patient told to let us know if she'd like to do anything for [facility] in the future. I did tell her that starting next year she will need pap smears to screen  for cervical cancer and to call us anytime next year to set that up. *Neuro: she states that she never took the keppra that was prescribed by neurology because she feels fine and can't remember the last time she had a seizure and doesn't believe it was a seizure and just a bad headache. I told her I recommend to follow up with them just to make sure nothing else needs to be done; number provided for her.   Interpreter used  RTC PRN  Mount[place]ide Bing, MD Center for [employer], [facility]

## 2021-02-13 ENCOUNTER — Encounter: Payer: Self-pay | Admitting: Obstetrics and Gynecology

## 2021-02-18 ENCOUNTER — Other Ambulatory Visit (HOSPITAL_COMMUNITY): Payer: Self-pay

## 2021-02-18 ENCOUNTER — Encounter (HOSPITAL_COMMUNITY): Payer: Self-pay | Admitting: Emergency Medicine

## 2021-02-18 ENCOUNTER — Other Ambulatory Visit: Payer: Self-pay

## 2021-02-18 ENCOUNTER — Telehealth (HOSPITAL_COMMUNITY): Payer: Self-pay

## 2021-02-18 ENCOUNTER — Ambulatory Visit (HOSPITAL_COMMUNITY)
Admission: EM | Admit: 2021-02-18 | Discharge: 2021-02-18 | Disposition: A | Discharge status: Home / Self Care | Payer: Medicaid Other | Attending: Physician Assistant | Admitting: Physician Assistant

## 2021-02-18 DIAGNOSIS — N61 Mastitis without abscess: Secondary | ICD-10-CM | POA: Diagnosis not present

## 2021-02-18 MED ORDER — CEPHALEXIN 500 MG PO CAPS
500.0000 mg | ORAL_CAPSULE | Freq: Four times a day (QID) | ORAL | 0 refills | Status: DC
  Filled 2021-02-18: qty 28, 7d supply, fill #0

## 2021-02-18 MED ORDER — CEPHALEXIN 500 MG PO CAPS: 500.0000 mg | ORAL_CAPSULE | Freq: Four times a day (QID) | ORAL | 0 refills | Status: DC

## 2021-02-18 NOTE — Discharge Instructions (Addendum)
Please take antibiotic as prescribed.  Use warm compresses.  Use Tylenol for pain.  If you have any worsening symptoms you need to be reevaluated.

## 2021-02-18 NOTE — ED Notes (Signed)
Patient is breast fed and bottle

## 2021-02-18 NOTE — ED Provider Notes (Signed)
[facility]    CSN: [id number] Arrival date & time: 02/18/21  1137      History   Chief Complaint Chief Complaint  Patient presents with   Breast Problem    HPI Gabrielle Wilson is a 20 y.o. female.   Patient presents today accompanied by her husband who provided the majority of history.  Patient is a Ecologist speaking and interpreter was utilized during this visit.  Reports a 1 day history of left breast tenderness.  Reports pain has worsened overnight and is currently a nauseated and pain scale, localized to lateral left breast, described as aching, no aggravating leaving factors identified.  She reports associated fever, nausea, vomiting that the symptoms have improved overnight.  She is currently breast-feeding.  She denies any recent antibiotic use.  Denies history of recurrent skin infections including MRSA.  Denies personal or family history of breast cancer.   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 No pertinent surgical history.  OB History     Gravida  1   Para  1   Term  1   Preterm  0   AB  0   Living  1      SAB  0   IAB  0   Ectopic  0   Multiple  0   Live Births  1            Home Medications    Prior to Admission medications   Medication Sig Start Date End Date Taking? Authorizing Provider  cephALEXin (KEFLEX) 500 MG capsule Take 1 capsule (500 mg total) by mouth 4 (four) times daily. 02/18/21  Yes Meka Lewan, Noberto Retort, PA-C  acetaminophen (TYLENOL) 325 MG tablet Take 2 tablets (650 mg total) by mouth every 6 (six) hours as needed for mild pain, moderate pain, fever or headache (for pain scale < 4). Patient not taking: Reported on 02/11/2021 10/21/20   Raelyn Mora, CNM  coconut  oil OIL Apply 1 application topically as needed (nipple pain). Patient not taking: Reported on 02/11/2021 10/21/20   Raelyn Mora, CNM  ferrous sulfate 325 (65 FE) MG tablet Take 1 tablet (325 mg total) by mouth every other day. Patient not taking: Reported on 02/11/2021 10/21/20   Raelyn Mora, CNM  ibuprofen (ADVIL) 600 MG tablet Take 1 tablet (600 mg total) by mouth every 8 (eight) hours as needed for moderate pain or cramping. Patient not taking: Reported on 02/11/2021 10/21/20   Raelyn Mora, CNM  levETIRAcetam (KEPPRA) 500 MG tablet Take 1/2 tablet in morning, 1/2 tablet in evening for 1 week, then increase to 1 tablet in morning, 1 tablet in evening Patient not taking: Reported on 02/11/2021 07/02/20   Van Clines, MD  Prenatal Vit-Fe Fumarate-FA (MULTIVITAMIN-PRENATAL) 27-0.8 MG TABS tablet Take 1 tablet by mouth daily. Patient not taking: Reported on 02/11/2021    [provider]    Family History History reviewed. No pertinent family history.  Social History Social History   Tobacco Use   Smoking status: Never   Smokeless tobacco: Never  Vaping Use   Vaping Use: Never used  Substance Use Topics   Alcohol use: Not Currently   Drug use: Not Currently  Allergies   Patient has no known allergies.   Review of Systems Review of Systems  Constitutional:  Positive for activity change, fatigue and fever. Negative for appetite change.  Respiratory:  Negative for cough and shortness of breath.   Cardiovascular:  Negative for chest pain.  Gastrointestinal:  Positive for nausea and vomiting. Negative for abdominal pain and diarrhea.  Skin:  Positive for color change. Negative for wound.  Neurological:  Negative for dizziness, light-headedness and headaches.    Physical Exam Triage Vital Signs ED Triage Vitals  Enc Vitals Group     BP 02/18/21 1330 116/76     Pulse Rate 02/18/21 1330 94     Resp 02/18/21 1330 16     Temp 02/18/21 1330 98.7 F (37.1 C)      Temp Source 02/18/21 1330 Oral     SpO2 02/18/21 1330 98 %     Weight --      Height --      Head Circumference --      Peak Flow --      Pain Score 02/18/21 1342 8     Pain Loc --      Pain Edu? --      Excl. in GC? --    No data found.  Updated Vital Signs BP 116/76 (BP Location: Right Arm)   Pulse 94   Temp 98.7 F (37.1 C) (Oral)   Resp 16   SpO2 98%   Visual Acuity Right Eye Distance:   Left Eye Distance:   Bilateral Distance:    Right Eye Near:   Left Eye Near:    Bilateral Near:     Physical Exam Vitals reviewed.  Constitutional:      General: She is awake. She is not in acute distress.    Appearance: Normal appearance. She is well-developed. She is not ill-appearing.     Comments: Very pleasant female appears stated age in no acute distress sitting comfortably in exam room accompanied by her husband and infant child  HENT:     Head: Normocephalic and atraumatic.  Cardiovascular:     Rate and Rhythm: Normal rate and regular rhythm.     Heart sounds: Normal heart sounds, S1 normal and S2 normal. No murmur heard. Pulmonary:     Effort: Pulmonary effort is normal.     Breath sounds: Normal breath sounds. No wheezing, rhonchi or rales.     Comments: Clear to auscultation bilaterally Chest:  Breasts:    Left: Swelling and tenderness present. No inverted nipple, mass, nipple discharge or skin change.     Comments: Erythema and tenderness over lateral left breast without obvious abscess.  No nipple inversion or discharge.  No additional skin changes outside of erythema noted. Abdominal:     General: Bowel sounds are normal.     Palpations: Abdomen is soft.     Tenderness: There is no abdominal tenderness. There is no right CVA tenderness, left CVA tenderness, guarding or rebound.  Psychiatric:        Behavior: Behavior is cooperative.     UC Treatments / Results  Labs (all labs ordered are listed, but only abnormal results are displayed) Labs Reviewed - No  data to display  EKG   Radiology No results found.  Procedures Procedures (including critical care time)  Medications Ordered in UC Medications - No data to display  Initial Impression / Assessment and Plan / UC Course  I have reviewed the triage vital signs and the nursing  notes.  Pertinent labs & imaging results that were available during my care of the patient were reviewed by me and considered in my medical decision making (see chart for details).      No signs of inflammatory breast cancer that warrant emergent evaluation.  Symptoms consistent with mastitis.  Physical exam and vital signs are reassuring today as we will treat outpatient.  Patient was started on Keflex 500 mg 4 times a day.  She was encouraged to use warm compresses and Tylenol as needed for symptom relief.  Recommended follow-up with OB/GYN.  Discussed alarm symptoms that warrant emergent evaluation.  Strict return precautions given to which patient and husband expressed understanding.  Final Clinical Impressions(s) / UC Diagnoses   Final diagnoses:  Mastitis in female     Discharge Instructions      Please take antibiotic as prescribed.  Use warm compresses.  Use Tylenol for pain.  If you have any worsening symptoms you need to be reevaluated.     ED Prescriptions     Medication Sig Dispense Auth. Provider   cephALEXin (KEFLEX) 500 MG capsule Take 1 capsule (500 mg total) by mouth 4 (four) times daily. 28 capsule Sebastien Jackson K, PA-C      PDMP not reviewed this encounter.   Jeani Hawking, PA-C 02/18/21 1422

## 2021-02-18 NOTE — ED Triage Notes (Signed)
?   Bump on left breast causing left breast, shoulder pain since

## 2021-07-08 NOTE — Progress Notes (Signed)
° ° °  SUBJECTIVE:   CHIEF COMPLAINT / HPI:   Tooth ache: 21 yo woman reports about a week of tooth pain, it is present on left lower jaw, left upper jaw, and right upper jaw. Patient does not have a dentist. Denies fever, chills, able to eat, but cannot chew on left side.   In person interpreter for Pashto used throughout visit  PERTINENT  PMH / PSH: [place] refugee, language barrier  OBJECTIVE:   BP 115/72    Pulse 90    Wt 109 lb 6.4 oz (49.6 kg)    SpO2 100%    BMI 18.21 kg/m   Nursing note and vitals reviewed GEN: young [place] woman, resting comfortably in chair, NAD, WNWD HEENT: NCAT. PERRLA. Sclera without injection or icterus. MMM. Clear oropharynx, erythema and edema of left posterior lower and upper jaw, dental caries present diffusely, tooth number 18 cracked on lingual side, no induration Neck: Supple. No LAD. Cardiac: Regular rate and rhythm. Normal S1/S2. No murmurs, rubs, or gallops appreciated. 2+ radial pulses. Lungs: Clear bilaterally to ascultation. No increased WOB, no accessory muscle usage. No w/r/r. Neuro: AOx3  Ext: no edema Psych: Pleasant and appropriate   ASSESSMENT/PLAN:   Tooth infection 21 yo patient from [place] with many dental caries and broken tooth #18 presents with pain, erythema, and edema of left lower and upper jaws. Will treat pain with ibuprofen 800 mg alternating with tylenol q6h prn, will give oxycodone 5-325 mg #5 for break through pain, and augmentin 875-125 mg BID x7d. Gave patient list of dentists who take medicaid. Interpreter helped call to set up appointment. Discussed supportive care and return precautions in case of worsening prior to dental visit.     Shirlean Mylar, MD [facility]

## 2021-07-09 ENCOUNTER — Other Ambulatory Visit: Payer: Self-pay

## 2021-07-09 ENCOUNTER — Encounter: Payer: Self-pay | Admitting: Family Medicine

## 2021-07-09 ENCOUNTER — Ambulatory Visit (INDEPENDENT_AMBULATORY_CARE_PROVIDER_SITE_OTHER): Payer: Medicaid Other | Admitting: Family Medicine

## 2021-07-09 ENCOUNTER — Other Ambulatory Visit (HOSPITAL_COMMUNITY): Payer: Self-pay

## 2021-07-09 VITALS — BP 115/72 | HR 90 | Wt 109.4 lb

## 2021-07-09 DIAGNOSIS — K0889 Other specified disorders of teeth and supporting structures: Secondary | ICD-10-CM

## 2021-07-09 DIAGNOSIS — K047 Periapical abscess without sinus: Secondary | ICD-10-CM

## 2021-07-09 MED ORDER — IBUPROFEN 800 MG PO TABS
800.0000 mg | ORAL_TABLET | Freq: Three times a day (TID) | ORAL | 0 refills | Status: DC | PRN
  Filled 2021-07-09: qty 30, 10d supply, fill #0

## 2021-07-09 MED ORDER — AMOXICILLIN-POT CLAVULANATE 875-125 MG PO TABS
1.0000 | ORAL_TABLET | Freq: Two times a day (BID) | ORAL | 0 refills | Status: DC
  Filled 2021-07-09: qty 14, 7d supply, fill #0

## 2021-07-09 MED ORDER — OXYCODONE-ACETAMINOPHEN 5-300 MG PO TABS
1.0000 | ORAL_TABLET | ORAL | 0 refills | Status: DC | PRN
  Filled 2021-07-09: qty 5, 1d supply, fill #0

## 2021-07-09 MED ORDER — OXYCODONE-ACETAMINOPHEN 5-325 MG PO TABS
1.0000 | ORAL_TABLET | ORAL | 0 refills | Status: DC | PRN
  Filled 2021-07-09: qty 5, 1d supply, fill #0

## 2021-07-09 NOTE — Patient Instructions (Signed)
It was a pleasure to see you today!  For your tooth infection, please follow up with a dentist as soon as possible. Please take ibuprofen every 6 hours for your pain. If you have break through pain you may use oxycodone Take augmentin once in the morning and once at night for 1 week. Follow up if you have a fever or cannot drink liquids    Be Well,  Dr. Leary Roca  ?? ? ???? ?? ?? ?? ?? ???? ??? ?????!  1. ????? ? ?????? ? ??????? ?????? ??????? ???? ?? ?? ??? ? ?????? ????? ??? ????? ???. 2. ??????? ???? ? ??? ??? ????? ?? ??? 6 ??????? ?? ibuprofen ?????. ?? ???? ? ??? ?? ???? ??? ??? ???? ?? ???? ???? ?? ????????? ?????? 3. Augmentin ?? ?? ?? ???? ?? ?? ?? ?? ??? ?? ? ??? ???? ????? ?????. ????? ??? ?? ???? ??? ??? ?? ??? ???? ???? ????    ?? ?????  ????? ?????

## 2021-07-10 DIAGNOSIS — K047 Periapical abscess without sinus: Secondary | ICD-10-CM | POA: Insufficient documentation

## 2021-07-10 NOTE — Assessment & Plan Note (Addendum)
21 yo patient from [place] with many dental caries and broken tooth #18 presents with pain, erythema, and edema of left lower and upper jaws. Will treat pain with ibuprofen 800 mg alternating with tylenol q6h prn, will give oxycodone 5-325 mg #5 for break through pain, and augmentin 875-125 mg BID x7d. Gave patient list of dentists who take medicaid. Interpreter helped call to set up appointment. Discussed supportive care and return precautions in case of worsening prior to dental visit.

## 2021-07-23 ENCOUNTER — Other Ambulatory Visit (HOSPITAL_COMMUNITY)
Admission: RE | Admit: 2021-07-23 | Discharge: 2021-07-23 | Disposition: A | Discharge status: Home / Self Care | Payer: Medicaid Other | Source: Ambulatory Visit | Attending: Family Medicine | Admitting: Family Medicine

## 2021-07-23 ENCOUNTER — Other Ambulatory Visit (HOSPITAL_COMMUNITY): Payer: Self-pay

## 2021-07-23 ENCOUNTER — Other Ambulatory Visit: Payer: Self-pay

## 2021-07-23 ENCOUNTER — Ambulatory Visit (INDEPENDENT_AMBULATORY_CARE_PROVIDER_SITE_OTHER): Payer: Medicaid Other | Admitting: Family Medicine

## 2021-07-23 VITALS — BP 112/68 | HR 96 | Temp 98.1°F | Ht 65.0 in | Wt 110.0 lb

## 2021-07-23 DIAGNOSIS — Z124 Encounter for screening for malignant neoplasm of cervix: Secondary | ICD-10-CM | POA: Diagnosis not present

## 2021-07-23 DIAGNOSIS — R109 Unspecified abdominal pain: Secondary | ICD-10-CM | POA: Diagnosis present

## 2021-07-23 DIAGNOSIS — R102 Pelvic and perineal pain: Secondary | ICD-10-CM | POA: Insufficient documentation

## 2021-07-23 LAB — POCT URINALYSIS DIP (MANUAL ENTRY)
Bilirubin, UA: NEGATIVE
Blood, UA: NEGATIVE
Glucose, UA: NEGATIVE mg/dL
Leukocytes, UA: NEGATIVE
Nitrite, UA: NEGATIVE
Protein Ur, POC: NEGATIVE mg/dL
Spec Grav, UA: 1.025 (ref 1.010–1.025)
Urobilinogen, UA: 0.2 E.U./dL
pH, UA: 5.5 (ref 5.0–8.0)

## 2021-07-23 LAB — POCT WET PREP (WET MOUNT)
Clue Cells Wet Prep Whiff POC: NEGATIVE
Trichomonas Wet Prep HPF POC: ABSENT

## 2021-07-23 LAB — POCT URINE PREGNANCY: Preg Test, Ur: NEGATIVE

## 2021-07-23 MED ORDER — POLYETHYLENE GLYCOL 3350 17 GM/SCOOP PO POWD
17.0000 g | Freq: Two times a day (BID) | ORAL | 1 refills | Status: DC | PRN
  Filled 2021-07-23: qty 1020, 30d supply, fill #0

## 2021-07-23 NOTE — Progress Notes (Signed)
? ? ?  SUBJECTIVE:  ? ?CHIEF COMPLAINT / HPI:  ? ?Abdominal pain: ?She states that her symptoms started this about a week with abdominal pain. This morning she had dizziness, shaking and headache. She denies cough, congestion or vomiting. She continues to have abdominal pain. She states it is constant and in the lower abdominal area, primarily on the left side. Denies dysuria. She denies a period since her child was born. Patient had spontaneous vaginal delivery on 10/19/2020. She denies diarrhea but does complain of hard stools. No blood in stool. ? ?Pashto interpreter used for patient encounter ? ?PERTINENT  PMH / PSH: History of seizure disorder ? ?OBJECTIVE:  ? ?BP 112/68   Pulse 96   Temp 98.1 ?F (36.7 ?C) (Oral)   Ht 5\' 5"  (1.651 m)   Wt 110 lb (49.9 kg)   SpO2 100%   BMI 18.30 kg/m?   ? ?Patient and her spouse requested a female physician to do the physical exam.  I spoke with Dr. Owens Shark, attending, who is going to perform the exam.  See her attestation. ? ?ASSESSMENT/PLAN:  ? ?Abdominal pain: ?Physical exam performed by attending Dr. Owens Shark per patient request showed no obvious signs of a cause for abdominal pain, no signs of of ovarian torsion.  Discussed case with her after physical exam was performed. Pregnancy test is negative.  Possibilities can include constipation as patient has complaints of hard stools. Will trial miralax.  Patient denies dysuria so less likely UTI.  We will check a urinalysis.  UA shows no obvious cause of her symptoms. Wet prep with only few yeasts. Discussed following up with primary doctor in the next few weeks or sooner if her symptoms worsen. Pap was performed by Dr. Owens Shark, see attestation. ? ?Lurline Del, DO ?Louise  ? ? ? ?

## 2021-07-23 NOTE — Patient Instructions (Addendum)
I want you to schedule follow-up with your primary doctor in the next 4 to 8 weeks.  If you have any worsening symptoms please return before that.  We are going to check your urine for any signs of urine infection.  It is also possible that your symptoms are due to constipation. ? ?I want you to try MiraLAX 1 capful mixed in fluid each day.  You can increase this to 2 capfuls mixed in fluid each day if needed for 1 soft stool each day. ?

## 2021-07-23 NOTE — Addendum Note (Signed)
Addended by: Jennette Bill on: 07/23/2021 03:46 PM ? ? Modules accepted: Orders ? ?

## 2021-07-26 LAB — CERVICOVAGINAL ANCILLARY ONLY
Chlamydia: NEGATIVE
Comment: NEGATIVE
Comment: NORMAL
Neisseria Gonorrhea: NEGATIVE

## 2021-07-26 LAB — CYTOLOGY - PAP: Diagnosis: NEGATIVE

## 2021-08-02 ENCOUNTER — Other Ambulatory Visit (HOSPITAL_COMMUNITY): Payer: Self-pay

## 2021-11-22 ENCOUNTER — Ambulatory Visit (HOSPITAL_COMMUNITY)
Admission: EM | Admit: 2021-11-22 | Discharge: 2021-11-22 | Disposition: A | Discharge status: Home / Self Care | Payer: Medicaid Other | Attending: Internal Medicine | Admitting: Internal Medicine

## 2021-11-22 ENCOUNTER — Encounter (HOSPITAL_COMMUNITY): Payer: Self-pay | Admitting: Emergency Medicine

## 2021-11-22 DIAGNOSIS — R1030 Lower abdominal pain, unspecified: Secondary | ICD-10-CM | POA: Diagnosis present

## 2021-11-22 DIAGNOSIS — N3000 Acute cystitis without hematuria: Secondary | ICD-10-CM | POA: Insufficient documentation

## 2021-11-22 LAB — POCT URINALYSIS DIPSTICK, ED / UC
Bilirubin Urine: NEGATIVE
Glucose, UA: NEGATIVE mg/dL
Hgb urine dipstick: NEGATIVE
Nitrite: NEGATIVE
Protein, ur: NEGATIVE mg/dL
Specific Gravity, Urine: 1.015 (ref 1.005–1.030)
Urobilinogen, UA: 0.2 mg/dL (ref 0.0–1.0)
pH: 5.5 (ref 5.0–8.0)

## 2021-11-22 LAB — POC URINE PREG, ED: Preg Test, Ur: NEGATIVE

## 2021-11-22 MED ORDER — CEPHALEXIN 500 MG PO CAPS: 500.0000 mg | ORAL_CAPSULE | Freq: Three times a day (TID) | ORAL | 0 refills | Status: AC

## 2021-11-22 MED ORDER — ONDANSETRON 4 MG PO TBDP: 4.0000 mg | ORAL_TABLET | Freq: Three times a day (TID) | ORAL | 0 refills | Status: DC | PRN

## 2021-11-22 MED ORDER — ACETAMINOPHEN 500 MG PO TABS: 1000.0000 mg | ORAL_TABLET | Freq: Four times a day (QID) | ORAL | 0 refills | Status: AC | PRN

## 2021-11-22 NOTE — Discharge Instructions (Addendum)
? ????????   5 ???? ????? ?? ??? ?? 3 ??? Keflex ????? ???? ? ????? ? ???? ????? ?????? ????. ? ????? ????? ???? ??. ?? ???? ???? ??? ?? ?????????? ??? ???????? ??? ?? ?? ???????? 2 ??? ?? 3 ???? ?? ?? ?? ??? ??????? ???? ????? ????. ?? ????? ??? ????? ????? ??? ??????? ???? ????? ???? ?? ??? ??. ????? ? ?????? ??????? ???? ????? ??? ????? ? ??? ??? ?????? ?? ?????? ?? ???????? ? ?????? ????? ????? ????? ????? ???. ?? ???? ??? ?? ???? ?? ????? ????!    Take Keflex 3 times daily for the next 5 days to treat urinary tract infection. Urine culture is pending.  If you develop any new or worsening symptoms or do not improve in the next 2 to 3 days, please return.  If your symptoms are severe, please go to the emergency room.  Follow-up with your primary care provider for further evaluation and management of your symptoms as well as ongoing wellness visits.  I hope you feel better!

## 2021-11-22 NOTE — ED Provider Notes (Signed)
[facility]    CSN: [id number] Arrival date & time: 11/22/21  1407      History   Chief Complaint Chief Complaint  Patient presents with   Abdominal Pain    HPI Gabrielle Wilson is a 21 y.o. female.   Patient presents to urgent care for evaluation of urinary frequency and lower abdominal pain since last night.  Patient denies dysuria, urinary urgency, vaginal discharge, vaginal bleeding, vaginal itching, odor, rash, fever/chills, nausea, vomiting, diarrhea, constipation, and headache.  She is also reporting a small amount of low back pain since last night as well.  She is currently breast-feeding her child and denies using over-the-counter medications for her symptoms.  Denies excessive intake of urinary irritants.  Denies constipation and states that her last normal bowel movement was today. No other aggravating or relieving factors identified for patient's symptoms at this time.   Pashto medical interpreter used for entirety of patient encounter.   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 No pertinent surgical history.  OB History     Gravida  1   Para  1   Term  1   Preterm  0   AB  0   Living  1      SAB  0   IAB  0   Ectopic  0   Multiple  0   Live Births  1            Home Medications    Prior to Admission medications   Medication Sig Start Date End Date Taking? Authorizing Provider  acetaminophen (TYLENOL) 500 MG tablet Take 2 tablets (1,000 mg total) by mouth every 6 (six) hours as needed. 11/22/21  Yes Carlisle Beers, FNP  cephALEXin (KEFLEX) 500 MG capsule Take 1 capsule (500 mg total) by mouth 3 (three) times daily for 5  days. 11/22/21 11/27/21 Yes Natsumi Whitsitt, Donavan Burnet, FNP  ferrous sulfate 325 (65 FE) MG tablet Take 1 tablet (325 mg total) by mouth every other day. Patient not taking: Reported on 02/11/2021 10/21/20   Raelyn Mora, CNM  ibuprofen (ADVIL) 800 MG tablet Take 1 tablet (800 mg total) by mouth every 8 (eight) hours as needed for fever, moderate pain, mild pain or headache. 07/09/21   Shirlean Mylar, MD  levETIRAcetam (KEPPRA) 500 MG tablet Take 1/2 tablet in morning, 1/2 tablet in evening for 1 week, then increase to 1 tablet in morning, 1 tablet in evening Patient not taking: Reported on 02/11/2021 07/02/20   Van Clines, MD    Family History No family history on file.  Social History Social History   Tobacco Use   Smoking status: Never   Smokeless tobacco: Never  Vaping Use   Vaping Use: Never used  Substance Use Topics   Alcohol use: Not Currently   Drug use: Not Currently     Allergies   Patient has no known allergies.   Review of Systems Review of Systems  Gastrointestinal:  Positive for abdominal pain.  Per HPI   Physical Exam Triage Vital Signs ED Triage Vitals  Enc Vitals Group     BP 11/22/21 1623 114/73  Pulse Rate 11/22/21 1623 75     Resp 11/22/21 1623 15     Temp 11/22/21 1623 98 F (36.7 C)     Temp Source 11/22/21 1623 Oral     SpO2 11/22/21 1623 98 %     Weight --      Height --      Head Circumference --      Peak Flow --      Pain Score 11/22/21 1620 10     Pain Loc --      Pain Edu? --      Excl. in GC? --    No data found.  Updated Vital Signs BP 114/73 (BP Location: Right Arm)   Pulse 75   Temp 98 F (36.7 C) (Oral)   Resp 15   SpO2 98%   Visual Acuity Right Eye Distance:   Left Eye Distance:   Bilateral Distance:    Right Eye Near:   Left Eye Near:    Bilateral Near:     Physical Exam Vitals and nursing note reviewed.  Constitutional:      Appearance: Normal appearance. She is not ill-appearing or toxic-appearing.      Comments: Very pleasant patient sitting on exam in position of comfort table in no acute distress.   HENT:     Head: Normocephalic and atraumatic.     Right Ear: Hearing and external ear normal.     Left Ear: Hearing and external ear normal.     Nose: Nose normal.     Mouth/Throat:     Lips: Pink.     Mouth: Mucous membranes are moist.  Eyes:     General: Lids are normal. Vision grossly intact. Gaze aligned appropriately.     Extraocular Movements: Extraocular movements intact.     Conjunctiva/sclera: Conjunctivae normal.  Cardiovascular:     Rate and Rhythm: Normal rate and regular rhythm.     Heart sounds: Normal heart sounds, S1 normal and S2 normal.  Pulmonary:     Effort: Pulmonary effort is normal. No respiratory distress.     Breath sounds: Normal breath sounds and air entry.  Abdominal:     General: Abdomen is flat. Bowel sounds are normal.     Palpations: Abdomen is soft.     Tenderness: There is abdominal tenderness in the suprapubic area and left lower quadrant. There is no right CVA tenderness, left CVA tenderness or guarding.  Musculoskeletal:     Cervical back: Neck supple.  Skin:    General: Skin is warm and dry.     Capillary Refill: Capillary refill takes less than 2 seconds.     Findings: No rash.  Neurological:     General: No focal deficit present.     Mental Status: She is alert and oriented to person, place, and time. Mental status is at baseline.     Cranial Nerves: No dysarthria or facial asymmetry.     Motor: No weakness.     Gait: Gait is intact.  Psychiatric:        Mood and Affect: Mood normal.        Speech: Speech normal.        Behavior: Behavior normal.        Thought Content: Thought content normal.        Judgment: Judgment normal.      UC Treatments / Results  Labs (all labs ordered are listed, but only abnormal results are displayed) Labs Reviewed  POCT URINALYSIS DIPSTICK,  ED / UC - Abnormal; Notable for the following components:       Result Value   Ketones, ur TRACE (*)    Leukocytes,Ua SMALL (*)    All other components within normal limits  URINE CULTURE  POC URINE PREG, ED    EKG   Radiology No results found.  Procedures Procedures (including critical care time)  Medications Ordered in UC Medications - No data to display  Initial Impression / Assessment and Plan / UC Course  I have reviewed the triage vital signs and the nursing notes.  Pertinent labs & imaging results that were available during my care of the patient were reviewed by me and considered in my medical decision making (see chart for details).  1.  Acute cystitis without hematuria Keflex prescribed to be taken 3 times daily for the next 5 days to treat acute urinary tract infection.  Urine culture pending.  Patient may take Tylenol every 6 hours as needed for any abdominal pain she may have.  Patient instructed to increase water intake to at least 64 ounces of water per day to prevent dehydration and return to urgent care if she does not experience improvement in symptoms in the next 2 to 3 days while on antibiotics.  Discussed physical exam and available lab work findings in clinic with patient.  Counseled patient regarding appropriate use of medications and potential side effects for all medications recommended or prescribed today. Discussed red flag signs and symptoms of worsening condition,when to call the PCP office, return to urgent care, and when to seek higher level of care in the emergency department. Patient verbalizes understanding and agreement with plan. All questions answered. Patient discharged in stable condition.  Final Clinical Impressions(s) / UC Diagnoses   Final diagnoses:  Lower abdominal pain  Acute cystitis without hematuria     Discharge Instructions      ? ???????? 5 ???? ????? ?? ??? ?? 3 ??? Keflex ????? ???? ? ????? ? ???? ????? ?????? ????. ? ????? ????? ???? ??. ?? ???? ???? ??? ?? ?????????? ???  ???????? ??? ?? ?? ???????? 2 ??? ?? 3 ???? ?? ?? ?? ??? ??????? ???? ????? ????. ?? ????? ??? ????? ????? ??? ??????? ???? ????? ???? ?? ??? ??. ????? ? ?????? ??????? ???? ????? ??? ????? ? ??? ??? ?????? ?? ?????? ?? ???????? ? ?????? ????? ????? ????? ????? ???. ?? ???? ??? ?? ???? ?? ????? ????!    Take Keflex 3 times daily for the next 5 days to treat urinary tract infection. Urine culture is pending.  If you develop any new or worsening symptoms or do not improve in the next 2 to 3 days, please return.  If your symptoms are severe, please go to the emergency room.  Follow-up with your primary care provider for further evaluation and management of your symptoms as well as ongoing wellness visits.  I hope you feel better!     ED Prescriptions     Medication Sig Dispense Auth. Provider   cephALEXin (KEFLEX) 500 MG capsule Take 1 capsule (500 mg total) by mouth 3 (three) times daily for 5 days. 15 capsule Reita May M, FNP   acetaminophen (TYLENOL) 500 MG tablet Take 2 tablets (1,000 mg total) by mouth every 6 (six) hours as needed. 30 tablet Reita May M, FNP   ondansetron (ZOFRAN-ODT) 4 MG disintegrating tablet  (Status: Discontinued) Take 1 tablet (4 mg total) by mouth every 8 (eight) hours as needed for nausea or  vomiting. 20 tablet Carlisle Beers, FNP      PDMP not reviewed this encounter.   Carlisle Beers, [place] 11/22/21 1744

## 2021-11-22 NOTE — ED Triage Notes (Signed)
Pt has bilat lower lateral abd pains that started yesterday. Pain is constant. Pt is nauseated but denies vomiting, denies urinary or bowel problems.  LMP one month and 2 days ago, unsure if pregnant.

## 2021-11-23 LAB — URINE CULTURE: Culture: 30000 — AB

## 2022-01-31 ENCOUNTER — Other Ambulatory Visit: Payer: Self-pay

## 2022-01-31 ENCOUNTER — Emergency Department (HOSPITAL_COMMUNITY)
Admission: EM | Admit: 2022-01-31 | Discharge: 2022-02-01 | Discharge status: Against Medical Advice | Payer: Medicaid Other | Attending: Emergency Medicine | Admitting: Emergency Medicine

## 2022-01-31 ENCOUNTER — Encounter (HOSPITAL_COMMUNITY): Payer: Self-pay | Admitting: Emergency Medicine

## 2022-01-31 DIAGNOSIS — N644 Mastodynia: Secondary | ICD-10-CM | POA: Insufficient documentation

## 2022-01-31 DIAGNOSIS — R1030 Lower abdominal pain, unspecified: Secondary | ICD-10-CM | POA: Diagnosis not present

## 2022-01-31 DIAGNOSIS — Z5321 Procedure and treatment not carried out due to patient leaving prior to being seen by health care provider: Secondary | ICD-10-CM | POA: Diagnosis not present

## 2022-01-31 DIAGNOSIS — R300 Dysuria: Secondary | ICD-10-CM | POA: Diagnosis not present

## 2022-01-31 LAB — COMPREHENSIVE METABOLIC PANEL
ALT: 16 U/L (ref 0–44)
AST: 20 U/L (ref 15–41)
Albumin: 4.9 g/dL (ref 3.5–5.0)
Alkaline Phosphatase: 167 U/L — ABNORMAL HIGH (ref 38–126)
Anion gap: 14 (ref 5–15)
BUN: 10 mg/dL (ref 6–20)
CO2: 21 mmol/L — ABNORMAL LOW (ref 22–32)
Calcium: 9.9 mg/dL (ref 8.9–10.3)
Chloride: 105 mmol/L (ref 98–111)
Creatinine, Ser: 0.72 mg/dL (ref 0.44–1.00)
GFR, Estimated: >60 mL/min (ref >60)
Glucose, Bld: 111 mg/dL — ABNORMAL HIGH (ref 70–99)
Potassium: 4.8 mmol/L (ref 3.5–5.1)
Sodium: 140 mmol/L (ref 135–145)
Total Bilirubin: 0.7 mg/dL (ref 0.3–1.2)
Total Protein: 8.3 g/dL — ABNORMAL HIGH (ref 6.5–8.1)

## 2022-01-31 LAB — CBC WITH DIFFERENTIAL/PLATELET
Abs Immature Granulocytes: 0.05 10*3/uL (ref 0.00–0.07)
Basophils Absolute: 0 10*3/uL (ref 0.0–0.1)
Basophils Relative: 0 %
Eosinophils Absolute: 0.2 10*3/uL (ref 0.0–0.5)
Eosinophils Relative: 2 %
HCT: 38.5 % (ref 36.0–46.0)
Hemoglobin: 13.5 g/dL (ref 12.0–15.0)
Immature Granulocytes: 0 %
Lymphocytes Relative: 25 %
Lymphs Abs: 3.6 10*3/uL (ref 0.7–4.0)
MCH: 29.2 pg (ref 26.0–34.0)
MCHC: 35.1 g/dL (ref 30.0–36.0)
MCV: 83.2 fL (ref 80.0–100.0)
Monocytes Absolute: 0.7 10*3/uL (ref 0.1–1.0)
Monocytes Relative: 5 %
Neutro Abs: 9.8 10*3/uL — ABNORMAL HIGH (ref 1.7–7.7)
Neutrophils Relative %: 68 %
Platelets: 243 10*3/uL (ref 150–400)
RBC: 4.63 MIL/uL (ref 3.87–5.11)
RDW: 12.5 % (ref 11.5–15.5)
WBC: 14.4 10*3/uL — ABNORMAL HIGH (ref 4.0–10.5)
nRBC: 0 % (ref 0.0–0.2)

## 2022-01-31 LAB — URINALYSIS, ROUTINE W REFLEX MICROSCOPIC
Bilirubin Urine: NEGATIVE
Glucose, UA: NEGATIVE mg/dL
Hgb urine dipstick: NEGATIVE
Ketones, ur: NEGATIVE mg/dL
Leukocytes,Ua: NEGATIVE
Nitrite: NEGATIVE
Protein, ur: NEGATIVE mg/dL
Specific Gravity, Urine: 1.009 (ref 1.005–1.030)
pH: 5 (ref 5.0–8.0)

## 2022-01-31 LAB — PREGNANCY, URINE: Preg Test, Ur: NEGATIVE

## 2022-01-31 LAB — LIPASE, BLOOD: Lipase: 39 U/L (ref 11–51)

## 2022-01-31 NOTE — ED Triage Notes (Signed)
Patient reports right breast pain onset yesterday , denies injury , she adds intermittent hypogastric abdominal pain with occasional emesis fr 1 weeks , denies fever or chills .

## 2022-01-31 NOTE — ED Notes (Signed)
Patient states the wait is too long and she is leaving 

## 2022-01-31 NOTE — ED Provider Triage Note (Signed)
Emergency Medicine Provider Triage Evaluation Note  Gabrielle Wilson , a 21 y.o. female  was evaluated in triage.  Pt complains of right breast pain and tenderness.  She is currently breast-feeding.  She denies redness or clogged mammary duct.  She is still producing milk.  She also endorses some lower abdominal pain with dysuria.  Denies hematuria, fevers, chills, nausea, vomiting and diarrhea.  Review of Systems  Positive:  Negative:   Physical Exam  BP 129/64 (BP Location: Right Arm)   Pulse (!) 101   Temp 98.3 F (36.8 C) (Oral)   Resp 16   LMP 01/14/2022   SpO2 96%  Gen:   Awake, no distress   Resp:  Normal effort  MSK:   Moves extremities without difficulty  Other:  Abdomen is soft, nondistended, some tenderness in the suprapubic area; right breast is without acute erythema or cellulitic changes.  Exquisitely tender to palpation  Medical Decision Making  Medically screening exam initiated at 8:23 PM.  Appropriate orders placed.  Ayrabella Kincade was informed that the remainder of the evaluation will be completed by another provider, this initial triage assessment does not replace that evaluation, and the importance of remaining in the ED until their evaluation is complete.     Janell Quiet, [place] 01/31/22 2024

## 2022-02-01 ENCOUNTER — Encounter (HOSPITAL_COMMUNITY): Payer: Self-pay | Admitting: *Deleted

## 2022-02-01 ENCOUNTER — Ambulatory Visit (HOSPITAL_COMMUNITY)
Admission: EM | Admit: 2022-02-01 | Discharge: 2022-02-01 | Disposition: A | Discharge status: Home / Self Care | Payer: Medicaid Other | Attending: Family Medicine | Admitting: Family Medicine

## 2022-02-01 ENCOUNTER — Other Ambulatory Visit: Payer: Self-pay

## 2022-02-01 DIAGNOSIS — R109 Unspecified abdominal pain: Secondary | ICD-10-CM | POA: Diagnosis not present

## 2022-02-01 DIAGNOSIS — N61 Mastitis without abscess: Secondary | ICD-10-CM | POA: Diagnosis not present

## 2022-02-01 LAB — POCT URINALYSIS DIPSTICK, ED / UC
Glucose, UA: NEGATIVE mg/dL
Hgb urine dipstick: NEGATIVE
Ketones, ur: 15 mg/dL — AB
Leukocytes,Ua: NEGATIVE
Nitrite: NEGATIVE
Protein, ur: 30 mg/dL — AB
Specific Gravity, Urine: 1.025 (ref 1.005–1.030)
Urobilinogen, UA: 0.2 mg/dL (ref 0.0–1.0)
pH: 5.5 (ref 5.0–8.0)

## 2022-02-01 MED ORDER — CEPHALEXIN 500 MG PO CAPS: 500.0000 mg | ORAL_CAPSULE | Freq: Four times a day (QID) | ORAL | 0 refills | Status: AC

## 2022-02-01 NOTE — ED Triage Notes (Signed)
Pt has pain to RT breast. Pt has been breast feeding.

## 2022-02-01 NOTE — ED Triage Notes (Signed)
TC to mobile # given during check in. No answer a number listed

## 2022-02-01 NOTE — ED Provider Notes (Signed)
[place]    CSN: [id number] Arrival date & time: 02/01/22  1506      History   Chief Complaint Chief Complaint  Patient presents with   Breast Pain    Rt    HPI Gabrielle Wilson is a 21 y.o. female.   Pt presents with several days of right breast pain, currently breastfeeding.  Reports subjective fevers at home.  She reports pain with breastfeeding.  She has tried nothing for the sx.  She presented to the ED yesterday with similar sx, left without being seen.  Labs and UA normal.  She also complains of lower abdominal, denies dysuria.      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 No pertinent surgical history.  OB History     Gravida  1   Para  1   Term  1   Preterm  0   AB  0   Living  1      SAB  0   IAB  0   Ectopic  0   Multiple  0   Live Births  1            Home Medications    Prior to Admission medications   Medication Sig Start Date End Date Taking? Authorizing Provider  cephALEXin (KEFLEX) 500 MG capsule Take 1 capsule (500 mg total) by mouth 4 (four) times daily for 7 days. 02/01/22 02/08/22 Yes Ward, Lenise Arena, PA-C  acetaminophen (TYLENOL) 500 MG tablet Take 2 tablets (1,000 mg total) by mouth every 6 (six) hours as needed. 11/22/21   Talbot Grumbling, FNP  ferrous sulfate 325 (65 FE) MG tablet Take 1 tablet (325 mg total) by mouth every other day. Patient not taking: Reported on 02/11/2021 10/21/20   Laury Deep, CNM  ibuprofen (ADVIL) 800 MG tablet Take 1 tablet (800 mg total) by mouth every 8 (eight) hours as needed for fever, moderate pain, mild pain or headache. 07/09/21   Gladys Damme, MD  levETIRAcetam (KEPPRA) 500 MG tablet Take  1/2 tablet in morning, 1/2 tablet in evening for 1 week, then increase to 1 tablet in morning, 1 tablet in evening Patient not taking: Reported on 02/11/2021 07/02/20   Cameron Sprang, MD    Family History History reviewed. No pertinent family history.  Social History Social History   Tobacco Use   Smoking status: Never   Smokeless tobacco: Never  Vaping Use   Vaping Use: Never used  Substance Use Topics   Alcohol use: Not Currently   Drug use: Not Currently     Allergies   Patient has no known allergies.   Review of Systems Review of Systems  Constitutional:  Negative for chills and fever.  HENT:  Negative for ear pain and sore throat.   Eyes:  Negative for pain and visual disturbance.  Respiratory:  Negative for cough and shortness of breath.   Cardiovascular:  Negative for chest pain and palpitations.  Gastrointestinal:  Positive for abdominal pain. Negative for vomiting.  Genitourinary:  Negative for dysuria and hematuria.       Breast pain  Musculoskeletal:  Negative for arthralgias and back pain.  Skin:  Negative for color change and rash.  Neurological:  Negative for seizures and syncope.  All other systems reviewed and are negative.    Physical Exam Triage Vital Signs ED Triage Vitals [02/01/22 1707]  Enc Vitals Group     BP 108/71     Pulse Rate 91     Resp 16     Temp 97.7 F (36.5 C)     Temp src      SpO2 98 %     Weight      Height      Head Circumference      Peak Flow      Pain Score      Pain Loc      Pain Edu?      Excl. in GC?    No data found.  Updated Vital Signs BP 108/71   Pulse 91   Temp 97.7 F (36.5 C)   Resp 16   LMP 01/14/2022   SpO2 98%   Visual Acuity Right Eye Distance:   Left Eye Distance:   Bilateral Distance:    Right Eye Near:   Left Eye Near:    Bilateral Near:     Physical Exam Vitals and nursing note reviewed.  Constitutional:      General: She is not in acute distress.    Appearance: She is  well-developed.  HENT:     Head: Normocephalic and atraumatic.  Eyes:     Conjunctiva/sclera: Conjunctivae normal.  Cardiovascular:     Rate and Rhythm: Normal rate and regular rhythm.     Heart sounds: No murmur heard. Pulmonary:     Effort: Pulmonary effort is normal. No respiratory distress.     Breath sounds: Normal breath sounds.  Chest:    Abdominal:     Palpations: Abdomen is soft.     Tenderness: There is no abdominal tenderness.  Musculoskeletal:        General: No swelling.     Cervical back: Neck supple.  Skin:    General: Skin is warm and dry.     Capillary Refill: Capillary refill takes less than 2 seconds.  Neurological:     Mental Status: She is alert.  Psychiatric:        Mood and Affect: Mood normal.      UC Treatments / Results  Labs (all labs ordered are listed, but only abnormal results are displayed) Labs Reviewed  POCT URINALYSIS DIPSTICK, ED / UC - Abnormal; Notable for the following components:      Result Value   Bilirubin Urine SMALL (*)    Ketones, ur 15 (*)    Protein, ur 30 (*)    All other components within normal limits    EKG   Radiology No results found.  Procedures Procedures (including critical care time)  Medications Ordered in UC Medications - No data to display  Initial Impression / Assessment and Plan / UC Course  I have reviewed the triage vital signs and the nursing notes.  Pertinent labs & imaging results that were available during my care of the patient were reviewed by me and considered in my medical decision making (see chart for details).     Mastitis right breast.  Pt afebrile, non toxic, stable for discharge with antibiotic.  Keflex prescribed.  Discussed massage, warm compress, and continued milk expression.  UA normal, benign abdominal exam. Advised follow up with PCP.   Final Clinical Impressions(s) / UC Diagnoses   Final diagnoses:  Mastitis,  right, acute     Discharge Instructions      Take  antibiotic as prescribed Recommend warm compress, continue to express milk  If you develop fever or chill return here for evaluation or follow up with OBGYN Your urinalysis shows no signs of infection  Please follow up with OBGYN or Primary Care Physician    ED Prescriptions     Medication Sig Dispense Auth. Provider   cephALEXin (KEFLEX) 500 MG capsule Take 1 capsule (500 mg total) by mouth 4 (four) times daily for 7 days. 28 capsule Ward, Tylene Fantasia, PA-C      PDMP not reviewed this encounter.   Ward, Tylene Fantasia, PA-C 02/01/22 [phone]

## 2022-02-01 NOTE — Discharge Instructions (Addendum)
Take antibiotic as prescribed Recommend warm compress, continue to express milk  If you develop fever or chill return here for evaluation or follow up with OBGYN Your urinalysis shows no signs of infection  Please follow up with OBGYN or Primary Care Physician

## 2022-02-28 ENCOUNTER — Encounter (HOSPITAL_COMMUNITY): Payer: Self-pay | Admitting: *Deleted

## 2022-02-28 ENCOUNTER — Other Ambulatory Visit: Payer: Self-pay

## 2022-02-28 ENCOUNTER — Inpatient Hospital Stay (HOSPITAL_COMMUNITY): Payer: Medicaid Other

## 2022-02-28 ENCOUNTER — Inpatient Hospital Stay (HOSPITAL_COMMUNITY)
Admission: AD | Admit: 2022-02-28 | Discharge: 2022-02-28 | Disposition: A | Discharge status: Home / Self Care | Payer: Medicaid Other | Attending: Family Medicine | Admitting: Family Medicine

## 2022-02-28 ENCOUNTER — Ambulatory Visit (HOSPITAL_COMMUNITY): Admission: RE | Admit: 2022-02-28 | Payer: Medicaid Other | Source: Ambulatory Visit

## 2022-02-28 ENCOUNTER — Encounter (HOSPITAL_COMMUNITY): Payer: Self-pay | Admitting: Family Medicine

## 2022-02-28 ENCOUNTER — Ambulatory Visit (HOSPITAL_COMMUNITY)
Admission: EM | Admit: 2022-02-28 | Discharge: 2022-02-28 | Disposition: A | Discharge status: Other Acute Inpatient Hospital | Payer: Medicaid Other | Attending: Internal Medicine | Admitting: Internal Medicine

## 2022-02-28 DIAGNOSIS — Z3201 Encounter for pregnancy test, result positive: Secondary | ICD-10-CM | POA: Diagnosis not present

## 2022-02-28 DIAGNOSIS — O26891 Other specified pregnancy related conditions, first trimester: Secondary | ICD-10-CM

## 2022-02-28 DIAGNOSIS — Z3A01 Less than 8 weeks gestation of pregnancy: Secondary | ICD-10-CM | POA: Diagnosis not present

## 2022-02-28 DIAGNOSIS — Z3A00 Weeks of gestation of pregnancy not specified: Secondary | ICD-10-CM | POA: Diagnosis not present

## 2022-02-28 DIAGNOSIS — R102 Pelvic and perineal pain: Secondary | ICD-10-CM | POA: Insufficient documentation

## 2022-02-28 DIAGNOSIS — O3680X0 Pregnancy with inconclusive fetal viability, not applicable or unspecified: Secondary | ICD-10-CM | POA: Diagnosis not present

## 2022-02-28 DIAGNOSIS — Z789 Other specified health status: Secondary | ICD-10-CM

## 2022-02-28 DIAGNOSIS — O209 Hemorrhage in early pregnancy, unspecified: Secondary | ICD-10-CM | POA: Diagnosis present

## 2022-02-28 DIAGNOSIS — Z3490 Encounter for supervision of normal pregnancy, unspecified, unspecified trimester: Secondary | ICD-10-CM

## 2022-02-28 DIAGNOSIS — R030 Elevated blood-pressure reading, without diagnosis of hypertension: Secondary | ICD-10-CM | POA: Insufficient documentation

## 2022-02-28 DIAGNOSIS — O169 Unspecified maternal hypertension, unspecified trimester: Secondary | ICD-10-CM

## 2022-02-28 LAB — POCT URINALYSIS DIPSTICK, ED / UC
Bilirubin Urine: NEGATIVE
Glucose, UA: NEGATIVE mg/dL
Ketones, ur: NEGATIVE mg/dL
Leukocytes,Ua: NEGATIVE
Nitrite: NEGATIVE
Protein, ur: NEGATIVE mg/dL
Specific Gravity, Urine: 1.025 (ref 1.005–1.030)
Urobilinogen, UA: 0.2 mg/dL (ref 0.0–1.0)
pH: 5.5 (ref 5.0–8.0)

## 2022-02-28 LAB — URINALYSIS, ROUTINE W REFLEX MICROSCOPIC
Bacteria, UA: NONE SEEN
Bilirubin Urine: NEGATIVE
Glucose, UA: NEGATIVE mg/dL
Ketones, ur: NEGATIVE mg/dL
Leukocytes,Ua: NEGATIVE
Nitrite: NEGATIVE
Protein, ur: NEGATIVE mg/dL
RBC / HPF: >50 RBC/hpf — ABNORMAL HIGH (ref 0–5)
Specific Gravity, Urine: 1.014 (ref 1.005–1.030)
pH: 5 (ref 5.0–8.0)

## 2022-02-28 LAB — CBC
HCT: 36 % (ref 36.0–46.0)
Hemoglobin: 12.5 g/dL (ref 12.0–15.0)
MCH: 28.9 pg (ref 26.0–34.0)
MCHC: 34.7 g/dL (ref 30.0–36.0)
MCV: 83.3 fL (ref 80.0–100.0)
Platelets: 254 10*3/uL (ref 150–400)
RBC: 4.32 MIL/uL (ref 3.87–5.11)
RDW: 13.4 % (ref 11.5–15.5)
WBC: 5.5 10*3/uL (ref 4.0–10.5)
nRBC: 0 % (ref 0.0–0.2)

## 2022-02-28 LAB — WET PREP, GENITAL
Clue Cells Wet Prep HPF POC: NONE SEEN
Sperm: NONE SEEN
Trich, Wet Prep: NONE SEEN
WBC, Wet Prep HPF POC: <10 (ref <10)
Yeast Wet Prep HPF POC: NONE SEEN

## 2022-02-28 LAB — POC URINE PREG, ED: Preg Test, Ur: POSITIVE — AB

## 2022-02-28 LAB — HIV ANTIBODY (ROUTINE TESTING W REFLEX): HIV Screen 4th Generation wRfx: NONREACTIVE

## 2022-02-28 LAB — HCG, QUANTITATIVE, PREGNANCY: hCG, Beta Chain, Quant, S: 2370 m[IU]/mL — ABNORMAL HIGH (ref <5)

## 2022-02-28 NOTE — Discharge Instructions (Addendum)
Please go to the MAU for further management.

## 2022-02-28 NOTE — MAU Note (Signed)
Gabrielle Wilson is a 21 y.o. at Unknown here in MAU reporting: spotting with wiping that began this morning.  Also states having lower abdominal pain. LMP: 02/09/2022 Onset of complaint: today Pain score: 6 Vitals:   02/28/22 1135  BP: (!) 123/93  Pulse: 97  Resp: 19  Temp: 98.5 F (36.9 C)  SpO2: 100%     FHT:N/A Lab orders placed from triage:   UA

## 2022-02-28 NOTE — ED Triage Notes (Signed)
Pt reports a positive home pregnancy test 11/2 months ago the patient started to have vag bleeding today.Pt also has ABD pain.

## 2022-02-28 NOTE — Discharge Instructions (Addendum)
Return to MAU: If you have heavier bleeding that soaks through more that 2 pads per hour for an hour or more If you bleed so much that you feel like you might pass out or you do pass out If you have significant abdominal pain that is not improved with Tylenol 1000 mg every 8 hours as needed for pain If you develop a fever > 100.5   Prenatal Care Providers           [facility] [place] [facility] [place] [facility]  [place] [phone]  [facility] @ [place]  [phone]  [facility] @ [facility]       [address] [phone]            [facility] [place] @ [place]     [phone]          [facility] [place] @ [facility]   [address] [phone]  [facility] [place] @ [place] [phone]     [facility] [place] @ [address] [place])  [employer]   [phone]     [place] [facility]  Phone: [facility]  Phone: [facility] Phone: [phone]  [facility] Phone: [phone]  [facility] Phone: [phone]  [facility] Phone: [phone]  [facility]  Phone: [phone]

## 2022-02-28 NOTE — MAU Provider Note (Signed)
History     CSN: [id number]  Arrival date and time: 02/28/22 1054   Event Date/Time   First Provider Initiated Contact with Patient 02/28/22 1223      Chief Complaint  Patient presents with   Vaginal Bleeding   HPI [name] is a 21 y.o. year old G26P1001 female at [redacted]w[redacted]d ([redacted]w[redacted]d by U/S) weeks gestation who presents to MAU reporting vaginal bleeding that started with wiping only and lower abdominal pain; rated 6/10. She reports last SI was 4-5 days ago. She is still breastfeeding her toddler. 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 No pertinent surgical history.  History reviewed. No pertinent family history.  Social History   Tobacco Use   Smoking status: Never   Smokeless tobacco: Never  Vaping Use   Vaping Use: Never used  Substance Use Topics   Alcohol use: Not Currently   Drug use: Not Currently    Allergies:  Allergies  Allergen Reactions   Pork-Derived Products Other (See Comments)    Medications Prior to Admission  Medication Sig Dispense Refill Last Dose   acetaminophen (TYLENOL) 500 MG tablet Take 2 tablets (1,000 mg total) by mouth every 6 (six) hours as needed. 30 tablet 0 Unknown   ibuprofen (ADVIL) 800 MG tablet Take 1 tablet (800 mg total) by mouth every 8 (eight) hours as needed for fever, moderate pain, mild pain or headache. 30 tablet 0 Unknown    Review of Systems  Constitutional: Negative.   HENT: Negative.    Eyes: Negative.   Respiratory: Negative.    Cardiovascular: Negative.   Gastrointestinal: Negative.   Endocrine: Negative.   Genitourinary:  Positive for pelvic pain  and vaginal bleeding.  Musculoskeletal: Negative.   Skin: Negative.   Allergic/Immunologic: Negative.   Neurological: Negative.   Hematological: Negative.   Psychiatric/Behavioral: Negative.     Physical Exam   Patient Vitals for the past 24 hrs:  BP Temp Temp src Pulse Resp SpO2 Weight  02/28/22 1200 -- -- -- -- -- 100 % --  02/28/22 1158 137/71 -- -- (!) 107 18 -- --  02/28/22 1150 -- -- -- -- -- 100 % --  02/28/22 1148 (!) 140/68 -- -- (!) 107 18 -- --  02/28/22 1135 (!) 123/93 98.5 F (36.9 C) Oral 97 19 100 % --  02/28/22 1125 -- -- -- -- -- -- 49.1 kg    Physical Exam Vitals and nursing note reviewed. Exam conducted with a chaperone present.  Constitutional:      Appearance: Normal appearance. She is underweight.  Cardiovascular:     Rate and Rhythm: Tachycardia present.  Pulmonary:     Effort: Pulmonary effort is normal.  Abdominal:     Palpations: Abdomen is soft.  Genitourinary:    General: Normal vulva.     Comments: Pelvic exam: External genitalia normal, SE: vaginal walls  pink and well rugated, cervix is smooth, pink, no lesions, moderate amt of dark, red mucoid blood in vaginal vault -- WP, GC/CT done by RN prior to provider's exam, cervix visually closed, Uterus is non-tender, no CMT or friability, RT adnexal tenderness.  Musculoskeletal:        General: Normal range of motion.  Skin:    General: Skin is warm and dry.  Neurological:     Mental Status: She is alert and oriented to person, place, and time.  Psychiatric:        Mood and Affect: Mood normal.        Behavior: Behavior normal.        Thought Content: Thought content normal.        Judgment: Judgment normal.    MAU Course  Procedures  MDM CCUA UPT-- done at Urgent Care today CBC ABO/Rh HCG Wet Prep GC/CT -- pending HIV -- pending OB < 14 wks [place] with TV  Results for orders placed or performed during the hospital encounter of 02/28/22 (from the past 24 hour(s))  CBC     Status: None    Collection Time: 02/28/22 11:21 AM  Result Value Ref Range   WBC 5.5 4.0 - 10.5 K/uL   RBC 4.32 3.87 - 5.11 MIL/uL   Hemoglobin 12.5 12.0 - 15.0 g/dL   HCT 44.8 18.5 - 63.1 %   MCV 83.3 80.0 - 100.0 fL   MCH 28.9 26.0 - 34.0 pg   MCHC 34.7 30.0 - 36.0 g/dL   RDW 49.7 02.6 - 37.8 %   Platelets 254 150 - 400 K/uL   nRBC 0.0 0.0 - 0.2 %  hCG, quantitative, pregnancy     Status: Abnormal   Collection Time: 02/28/22 11:21 AM  Result Value Ref Range   hCG, Beta Chain, Quant, S 2,370 (H) <5 mIU/mL  HIV Antibody (routine testing w rflx)     Status: None   Collection Time: 02/28/22 11:21 AM  Result Value Ref Range   HIV Screen 4th Generation wRfx Non Reactive Non Reactive  Wet prep, genital     Status: None   Collection Time: 02/28/22 12:02 PM   Specimen: Vaginal  Result Value Ref Range   Yeast Wet Prep HPF POC NONE SEEN NONE SEEN   Trich, Wet Prep NONE SEEN NONE SEEN   Clue Cells Wet Prep HPF POC NONE SEEN NONE SEEN   WBC, Wet Prep HPF POC <10 <10   Sperm NONE SEEN   Urinalysis, Routine w reflex microscopic Vaginal     Status: Abnormal   Collection Time: 02/28/22 12:02 PM  Result Value Ref Range   Color, Urine YELLOW YELLOW   APPearance CLEAR CLEAR   Specific Gravity, Urine 1.014 1.005 - 1.030   pH 5.0 5.0 - 8.0   Glucose, UA NEGATIVE NEGATIVE mg/dL   Hgb urine dipstick LARGE (A) NEGATIVE   Bilirubin Urine NEGATIVE NEGATIVE   Ketones, ur NEGATIVE NEGATIVE mg/dL   Protein, ur NEGATIVE NEGATIVE mg/dL   Nitrite NEGATIVE NEGATIVE   Leukocytes,Ua NEGATIVE NEGATIVE   RBC / HPF >50 (H) 0 - 5 RBC/hpf   WBC, UA 0-5 0 - 5 WBC/hpf   Bacteria, UA NONE SEEN NONE SEEN   Squamous Epithelial / LPF 0-5 0 - 5    US OB LESS THAN 14 WEEKS WITH OB TRANSVAGINAL  Result Date: 02/28/2022 CLINICAL DATA:  Pregnant with bleeding EXAM: OBSTETRIC <14 WK [place] AND TRANSVAGINAL OB US TECHNIQUE: Both transabdominal and transvaginal ultrasound examinations  were performed for complete evaluation of the  gestation as well as the maternal uterus, adnexal regions, and pelvic cul-de-sac. Transvaginal technique was performed to assess early pregnancy. COMPARISON:  None Available. FINDINGS: Intrauterine gestational sac: Single Yolk sac:  Seen Embryo:  Not seen Cardiac Activity: Not seen MSD: 4.6 mm   5 w   2 d Subchorionic hemorrhage:  None visualized. Maternal uterus/adnexae: There is 2.5 cm complex cyst in the right ovary. Small amount of free fluid is seen in pelvis. IMPRESSION: There is a gestational sac within the uterus with yolk sac. There is no demonstrable fetal pole or fetal cardiac activity. Differential diagnostic possibilities would include very early normal IUP or failed gestation with incomplete abortion. Serial HCG estimations and short-term follow-up pelvic sonogram in 1-2 weeks may be considered. There is 2.5 cm complex cyst in the right adnexa, possibly hemorrhagic cyst/follicle. Small amount of free fluid is seen in cul-de-sac and pelvis suggesting recent rupture of ovarian cyst or follicle. Electronically Signed   By: Ernie Avena M.D.   On: 02/28/2022 13:41     Assessment and Plan  1. Vaginal bleeding affecting early pregnancy - Information provided on vaginal bleeding in pregnancy - Return to MAU: If you have heavier bleeding that soaks through more that 2 pads per hour for an hour or more If you bleed so much that you feel like you might pass out or you do pass out If you have significant abdominal pain that is not improved with Tylenol 1000 mg every 8 hours as needed for pain If you develop a fever > 100.5   2. Intrauterine pregnancy - Establish PNC by [redacted] weeks gestation - Information provided on [facility] and list of [place] OB Providers   3. Pelvic pain affecting pregnancy in first trimester, antepartum - Information provided on abdominal pain in pregnancy   4. Language barrier affecting health care - [employer] Video Tona Sensing [phone] used for  assessment and exam  [employer] Video Pashto Interpreter, Stacie Glaze [phone] used for d/s instructions and to schedule f/u appt  5. Pregnancy with uncertain fetal viability, single or unspecified fetus - Order placed for viability U/S in 2 wks - Scheduled for 03/15/2022 @ 1000. - Advised to arrive at 9:40 AM for U/S  6. Elevated blood pressure affecting pregnancy, antepartum - Watch BP  7. [redacted] weeks gestation of pregnancy   - Discharge home - Keep scheduled U/S appt on 03/15/22 @ 1000 - Patient verbalized an understanding of the plan of care and agrees.    Raelyn Mora, CNM 02/28/2022, 12:23 PM

## 2022-02-28 NOTE — ED Provider Notes (Signed)
[place]    CSN: [id number] Arrival date & time: 02/28/22  0932      History   Chief Complaint Chief Complaint  Patient presents with   Vaginal Bleeding    HPI Gabrielle Wilson is a 21 y.o. female accompanied by her husband to the urgent care.  Patient had a positive pregnancy test at home about a month or so ago.  Patient endorses having vaginal bleeding with lower abdominal pain today.  She has some nausea which is noticed new.  No fever or chills.  No dysuria urgency or frequency.  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 No pertinent surgical history.  OB History     Gravida  2   Para  1   Term  1   Preterm  0   AB  0   Living  1      SAB  0   IAB  0   Ectopic  0   Multiple  0   Live Births  1            Home Medications    Prior to Admission medications   Medication Sig Start Date End Date Taking? Authorizing Provider  acetaminophen (TYLENOL) 500 MG tablet Take 2 tablets (1,000 mg total) by mouth every 6 (six) hours as needed. 11/22/21   Talbot Grumbling, FNP  ibuprofen (ADVIL) 800 MG tablet Take 1 tablet (800 mg total) by mouth every 8 (eight) hours as needed for fever, moderate pain, mild pain or headache. 07/09/21   Gladys Damme, MD    Family History History reviewed. No pertinent family history.  Social History Social History   Tobacco Use   Smoking status: Never   Smokeless tobacco: Never  Vaping Use   Vaping Use: Never used  Substance Use Topics   Alcohol use: Not Currently   Drug use: Not Currently     Allergies   Pork-derived products   Review of Systems Review of Systems As per  HPI  Physical Exam Triage Vital Signs ED Triage Vitals  Enc Vitals Group     BP 02/28/22 1001 110/75     Pulse Rate 02/28/22 1001 (!) 107     Resp 02/28/22 1001 18     Temp 02/28/22 1001 99 F (37.2 C)     Temp src --      SpO2 02/28/22 1001 97 %     Weight --      Height --      Head Circumference --      Peak Flow --      Pain Score 02/28/22 0959 6     Pain Loc --      Pain Edu? --      Excl. in [place]? --    No data found.  Updated Vital Signs BP 110/75   Pulse (!) 107   Temp 99 F (37.2 C)   Resp 18   LMP 02/09/2022   SpO2 97%   Visual Acuity Right Eye Distance:   Left Eye Distance:   Bilateral Distance:    Right Eye Near:   Left Eye  Near:    Bilateral Near:     Physical Exam Vitals and nursing note reviewed.  Constitutional:      General: She is not in acute distress.    Appearance: She is not ill-appearing.  Pulmonary:     Effort: Pulmonary effort is normal.     Breath sounds: Normal breath sounds.  Neurological:     General: No focal deficit present.     Mental Status: She is alert and oriented to person, place, and time.      UC Treatments / Results  Labs (all labs ordered are listed, but only abnormal results are displayed) Labs Reviewed  POC URINE PREG, ED - Abnormal; Notable for the following components:      Result Value   Preg Test, Ur POSITIVE (*)    All other components within normal limits  POCT URINALYSIS DIPSTICK, ED / UC - Abnormal; Notable for the following components:   Hgb urine dipstick LARGE (*)    All other components within normal limits    EKG   Radiology US OB LESS THAN 14 WEEKS WITH OB TRANSVAGINAL  Result Date: 02/28/2022 CLINICAL DATA:  Pregnant with bleeding EXAM: OBSTETRIC <14 WK [place] AND TRANSVAGINAL OB US TECHNIQUE: Both transabdominal and transvaginal ultrasound examinations were performed for complete evaluation of the gestation as well as the maternal uterus, adnexal regions, and pelvic cul-de-sac.  Transvaginal technique was performed to assess early pregnancy. COMPARISON:  None Available. FINDINGS: Intrauterine gestational sac: Single Yolk sac:  Seen Embryo:  Not seen Cardiac Activity: Not seen MSD: 4.6 mm   5 w   2 d Subchorionic hemorrhage:  None visualized. Maternal uterus/adnexae: There is 2.5 cm complex cyst in the right ovary. Small amount of free fluid is seen in pelvis. IMPRESSION: There is a gestational sac within the uterus with yolk sac. There is no demonstrable fetal pole or fetal cardiac activity. Differential diagnostic possibilities would include very early normal IUP or failed gestation with incomplete abortion. Serial HCG estimations and short-term follow-up pelvic sonogram in 1-2 weeks may be considered. There is 2.5 cm complex cyst in the right adnexa, possibly hemorrhagic cyst/follicle. Small amount of free fluid is seen in cul-de-sac and pelvis suggesting recent rupture of ovarian cyst or follicle. Electronically Signed   By: Ernie Avena M.D.   On: 02/28/2022 13:41    Procedures Procedures (including critical care time)  Medications Ordered in UC Medications - No data to display  Initial Impression / Assessment and Plan / UC Course  I have reviewed the triage vital signs and the nursing notes.  Pertinent labs & imaging results that were available during my care of the patient were reviewed by me and considered in my medical decision making (see chart for details).     1.  Vaginal bleeding in first trimester of pregnancy: Patient is advised to go to the MAU for further evaluation Point-of-care pregnancy test is positive Return precautions given. Patient agrees to go to MAU for further evaluation. Final Clinical Impressions(s) / UC Diagnoses   Final diagnoses:  Vaginal bleeding in pregnancy, first trimester     Discharge Instructions      Please go to the MAU for further management.   ED Prescriptions   None    PDMP not reviewed this encounter.    Merrilee Jansky, MD 02/28/22 [phone]

## 2022-03-01 LAB — GC/CHLAMYDIA PROBE AMP (~~LOC~~) NOT AT ARMC
Chlamydia: NEGATIVE
Comment: NEGATIVE
Comment: NORMAL
Neisseria Gonorrhea: NEGATIVE

## 2022-03-15 ENCOUNTER — Other Ambulatory Visit: Payer: Medicaid Other

## 2022-04-29 ENCOUNTER — Ambulatory Visit: Payer: Medicaid Other

## 2022-04-29 ENCOUNTER — Ambulatory Visit (INDEPENDENT_AMBULATORY_CARE_PROVIDER_SITE_OTHER): Payer: Medicaid Other | Admitting: Student

## 2022-04-29 VITALS — BP 110/70 | HR 90 | Temp 98.7°F | Ht 65.0 in | Wt 102.8 lb

## 2022-04-29 DIAGNOSIS — Z3490 Encounter for supervision of normal pregnancy, unspecified, unspecified trimester: Secondary | ICD-10-CM | POA: Diagnosis present

## 2022-04-29 LAB — POCT URINE PREGNANCY: Preg Test, Ur: POSITIVE — AB

## 2022-04-29 MED ORDER — PRENATAL VITAMIN 27-0.8 MG PO TABS
1.0000 | ORAL_TABLET | Freq: Every day | ORAL | 3 refills | Status: DC
Start: 2022-04-29 — End: 2022-06-14

## 2022-04-29 NOTE — Assessment & Plan Note (Addendum)
Patient presents for confirmation of pregnancy. Patient was pregnant in [place], but reports she got some medicine from her dentist, that caused her to miscarry. Patient confirmed pregnant today, but unsure of LMP. Patient has some cramping, but denies any bleeding/spotting. Patient also has some nausea, but does not want medication to treat it. Patient is not on PNV. Patient has hx of hemoglobin D-[place] variant.  -F/u for initial OB appt -OB Labs -Dating [place] -PNV

## 2022-04-29 NOTE — Progress Notes (Signed)
  SUBJECTIVE:   CHIEF COMPLAINT / HPI:   Gabrielle Wilson is a 21 y.o. year old G2P1001 who presents for missed menses  Patient was pregnant in October, saw dentist who gave her medicine that caused her to miscarry. She is now pregnant again. Patient doesn't remember when LMP was exactly, but estimates it was 1st week in November.  Report's she's having HA from pain in her tooth, has seen Dentist. Has cramps in her stomach and some nausea, but is not throwing up, and denies any bleeding. She is not taking a prenatal vitamin, and does not want medicine for the nausea.    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  ASSESSMENT/PLAN:  Pregnancy, unspecified gestational age Assessment & Plan: Patient presents for confirmation of pregnancy. Patient was pregnant in [place], but reports she got some medicine from her dentist, that caused her to miscarry. Patient confirmed pregnant today, but unsure of LMP. Patient has some cramping, but denies any bleeding/spotting. Patient also has some nausea, but does not want medication to treat it. Patient is not on PNV. Patient has hx of hemoglobin D-[place] variant.  -F/u for initial OB appt -OB Labs -Dating [place] -PNV  Orders: -     CBC/D/Plt+RPR+Rh+ABO+RubIgG... -     POCT urine pregnancy -     US OB Comp Less 14 Wks; Future  Other orders -     Prenatal Vitamin; Take 1 tablet by mouth daily.  Dispense: 30 tablet; Refill: 3   No follow-ups on file. [place], MD 04/29/2022, 5:54 PM PGY-2, [facility]

## 2022-04-29 NOTE — Patient Instructions (Addendum)
It was great to see you! Thank you for allowing me to participate in your care!  We saw you today to confirm your pregnancy. Congratulations on your pregnancy!  Our plans for today:  - Lab work - Ultrasound to determine due date for baby  Call number to schedule appointment (Number down below) - Make appointment at front desk for "Initial OB visit" and tell them you want a female provider.    - Seek immediate medical care if  Cramping and spotting/bleeding  Develop intense abdominal pain  Get dehydrated and can't take fluids by mouth (dry cracked lips, not peeing)  Develop pain/discomfort with urination or have vaginal discharge  We are checking some labs today, I will call you if they are abnormal will send you a MyChart message or a letter if they are normal.  If you do not hear about your labs in the next 2 weeks please let us know.  Take care and seek immediate care sooner if you develop any concerns.   Dr. Bess Kinds, MD [facility]   ?? ????? ??? ???? ??? ?? ??! ???? ?? ?? ?? ????? ????? ?? ????? ?? ??????? ?? ???? ?????!  ??? ?? ???? ????? ?? ????? ????????? ????? ???. ????? ? ????????? ??????!  ? ?? ???? ????? ???? ???????: - ? ????????? ??? - ? ????? ? ???? ???? ???? ????? ????? ?????????? ? ?????? ??????? ????? ????? ?? ??? ???? (????? ?????) - ? "??????? OB ?????" ????? ?? ????? ??? ?? ?????? ???? ?? ??? ?? ??????? ?? ???? ????? ???? ?????? ?????.  - ?? ???? ?????? ??? ??????? ?????? ????? ?? ???/ ???? ????? ? ???? ???? ??? ???????? ??? ????????? ???? ?? ??? ???? ? ???? ?? ???? ?????? ????? (??? ??? ?????? ?? ????? ???) ? ????? ???? ??? ??? / ??????? ???????? ??? ?? ? ????? ??? ???? ????  ??? ?? ??? ???? ???????????? ?????? ???? ?? ?? ???? ?? ??? ???? ?? ??? ??? ?????? ?? ???? ?? ?? ? MyChart ????? ?? ?? ??? ?????? ?? ??? ?????? ??. ?? ???? ?? ???????? 2 ????? ?? ? ???? ???????????? ?? ??? ?? ???? ?? ??????? ???? ??? ?? ??? ?????.  ??????? ???? ?? ??  ?? ??? ? ??????? ?????? ???? ?? ???? ???? ??????? ???????? ???.  ????? ?????? ????? MD ? ????? ????? ????

## 2022-05-02 ENCOUNTER — Inpatient Hospital Stay: Admission: RE | Admit: 2022-05-02 | Payer: Medicaid Other | Source: Ambulatory Visit

## 2022-05-03 LAB — CBC/D/PLT+RPR+RH+ABO+RUBIGG...
Antibody Screen: NEGATIVE
Basophils Absolute: 0 10*3/uL (ref 0.0–0.2)
Basos: 0 %
Bilirubin, UA: NEGATIVE
EOS (ABSOLUTE): 0.3 10*3/uL (ref 0.0–0.4)
Eos: 5 %
Glucose, UA: NEGATIVE
HCV Ab: NONREACTIVE
HIV Screen 4th Generation wRfx: NONREACTIVE
Hematocrit: 36.2 % (ref 34.0–46.6)
Hemoglobin: 12 g/dL (ref 11.1–15.9)
Hepatitis B Surface Ag: NEGATIVE
Immature Grans (Abs): 0 10*3/uL (ref 0.0–0.1)
Immature Granulocytes: 0 %
Ketones, UA: NEGATIVE
Leukocytes,UA: NEGATIVE
Lymphocytes Absolute: 3 10*3/uL (ref 0.7–3.1)
Lymphs: 49 %
MCH: 28 pg (ref 26.6–33.0)
MCHC: 33.1 g/dL (ref 31.5–35.7)
MCV: 84 fL (ref 79–97)
Monocytes Absolute: 0.3 10*3/uL (ref 0.1–0.9)
Monocytes: 5 %
Neutrophils Absolute: 2.6 10*3/uL (ref 1.4–7.0)
Neutrophils: 41 %
Nitrite, UA: NEGATIVE
Platelets: 250 10*3/uL (ref 150–450)
Protein,UA: NEGATIVE
RBC, UA: NEGATIVE
RBC: 4.29 x10E6/uL (ref 3.77–5.28)
RDW: 13.6 % (ref 11.7–15.4)
RPR Ser Ql: NONREACTIVE
Rh Factor: POSITIVE
Rubella Antibodies, IGG: 3.98 index (ref >0.99)
Specific Gravity, UA: 1.02 (ref 1.005–1.030)
Urobilinogen, Ur: 1 mg/dL (ref 0.2–1.0)
WBC: 6.2 10*3/uL (ref 3.4–10.8)
pH, UA: 8.5 — ABNORMAL HIGH (ref 5.0–7.5)

## 2022-05-03 LAB — HCV INTERPRETATION

## 2022-05-03 LAB — MICROSCOPIC EXAMINATION
Bacteria, UA: NONE SEEN
Casts: NONE SEEN /lpf
WBC, UA: NONE SEEN /hpf (ref 0–5)

## 2022-05-03 LAB — URINE CULTURE, OB REFLEX

## 2022-05-04 ENCOUNTER — Encounter: Payer: Self-pay | Admitting: Student

## 2022-05-12 ENCOUNTER — Other Ambulatory Visit: Payer: Self-pay | Admitting: Student

## 2022-05-12 DIAGNOSIS — Z3490 Encounter for supervision of normal pregnancy, unspecified, unspecified trimester: Secondary | ICD-10-CM

## 2022-05-30 ENCOUNTER — Ambulatory Visit (INDEPENDENT_AMBULATORY_CARE_PROVIDER_SITE_OTHER): Payer: Medicaid Other | Admitting: Student

## 2022-05-30 VITALS — BP 108/75 | HR 82 | Temp 98.2°F | Wt 101.5 lb

## 2022-05-30 DIAGNOSIS — Z3490 Encounter for supervision of normal pregnancy, unspecified, unspecified trimester: Secondary | ICD-10-CM

## 2022-05-30 NOTE — Patient Instructions (Addendum)
It was great to see you! Thank you for allowing me to participate in your care!   Our plans for today:  - Please go to your ultrasound 06/01/2022 -arrive at 7:45- it is important to know what your dates are. We need to see you in clinic after this.   Take care and seek immediate care sooner if you develop any concerns.  Gerrit Heck, MD

## 2022-05-30 NOTE — Progress Notes (Addendum)
Patient Name: Gabrielle Wilson Date of Birth: 08/28/2000 [facility] Initial Prenatal Visit  Gabrielle Wilson is a 22 y.o. year old G2P1001 at [redacted]w[redacted]d who presents for her initial prenatal visit.  Patient had positive pregnancy test in October and bleeding that sent her to the MAU.  At that time had an ultrasound which did not show fetal pole or fetal cardiac activity. Thought to be either very early normal IUP or failed gestation with incomplete abortion. They recommended serial HCG and pelvic ultrasound in another 1-2 weeks but patient did not get this.  She has decreased in weight from October from 108 at that time to 101 now.  She is down 1 pound from last visit a month ago.  In person interpreter present during visit. Pregnancy is planned She reports  no issues, denies, no vaginal bleeding or LOF, no contractions or pelvic pain currently . She is taking a prenatal vitamin but feels that it does hurt her stomach sometimes She denies pelvic pain or vaginal bleeding.   Pregnancy Dating: The patient is dated by LMP-although inaccurate.  LMP: Patient is very unsure-middle of September?  But also says that it has been been 2 and a half months.. Period is certain:  No.  Periods were regular:  Yes.  LMP was a typical period:  Yes.  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  Per chart review Keppra was ordered however patient did not take this.  She has not had any seizures since.  She is not taking any medications currently. VTE: No  History of STI No Abnormal Pap smear:  No Genital herpes simplex:  No   PSH: Gynecologic Surgery:  no Surgical history reviewed, notable for: none  Obstetric History: Obstetric history tab updated and reviewed.  Summary of prior pregnancies: Updated Cesarean delivery: No  Gestational Diabetes:  No Hypertension in pregnancy: No History of preterm birth: No History of LGA/SGA infant:  No History of shoulder dystocia: No Indications for referral were reviewed, and the patient has no obstetric indications for referral to High Risk OB Clinic at this time.   Social History: Partner's name: Irfanullah  Tobacco use: No Alcohol use:  No Other substance use:  No  Current Medications:  Tylenol prn  Reviewed and appropriate in pregnancy.   Genetic and Infection Screen: Flow Sheet Updated Yes  Today's Vitals   05/30/22 0857  BP: 108/75  Pulse: 82  Temp: 98.2 F (36.8 C)  Weight: 101 lb 8 oz (46 kg)   Body mass index is 16.89 kg/m.   Prenatal Exam: Gen: Well nourished, well developed.  No distress.  Vitals noted. HEENT: Normocephalic, atraumatic.  Neck supple without cervical lymphadenopathy, thyromegaly or thyroid nodules.  Fair dentition. CV: RRR no murmur, gallops or rubs Lungs: CTA B.  Normal respiratory effort without wheezes or rales. Abd: soft, NTND. +  BS.  Uterus not appreciated above pelvis. Ext: No clubbing, cyanosis or edema. Psych: Normal grooming and dress.  Not depressed or anxious appearing.  Normal thought content and process without flight of ideas or looseness of associations  Fetal heart tones: Not detected and discussed with preceptor, ultrasound obtained -ultrasound scheduled in 2 days. I emphasized importance of going to this  appointment and following up after this  Assessment/Plan:  Tasia Frantom is a 22 y.o. G2P1001 at [redacted]w[redacted]d who presents to initiate prenatal care. She is doing well.  Current pregnancy issues include weight loss-down 1 pound not eating very well per husband although patient says that she is eating okay.  Unclear dating/pregnancy course.  She has not received an ultrasound after her first 1 during her bleeding episode in the MAU.  At that time they had said that it was either a early IUP or incomplete abortion.  I emphasized the patient that it is very important that she gets an ultrasound done on 1/10  Routine prenatal care: As dating is not reliable, a dating ultrasound has been ordered. Dating tab updated. Pre-pregnancy weight updated. Expected weight gain this pregnancy is 25-35 pounds  Prenatal labs reviewed Indications for referral to [facility] were reviewed and the patient-may need referral to neurology/high risk if patient is gravid given history of seizures during pregnancy in 2021 and had prescribed Keppra at that time.  Would like to get ultrasound/confirmation of pregnancy first Medication list reviewed and updated.  Recommended patient see a dentist for regular care.  Bleeding and pain precautions reviewed. Importance of prenatal vitamins reviewed.  Genetic screening offered. Patient opted for: patient undecided, will address at future visit. The patient has the following indications for aspirinto begin 81 mg at 12-16 weeks: One high risk condition: no single high risk condition  MORE than one moderate risk condition: none Aspirin was not  recommended today based upon above risk factors (one high risk condition or more than one moderate risk factor)  The patient will not be age 48 or over at time of delivery. Referral to genetic counseling was not offered today.  The patient has the following risk factors for preexisting diabetes: Reviewed indications for early 1 hour glucose testing, not  indicated . An early 1 hour glucose tolerance test was not ordered. Pregnancy Medical Home and PHQ-9 forms completed, problems noted: Yes  2. Pregnancy issues include the following which were addressed today:  Unclear dating-ultrasound to be performed in 2 days 06/01/2022-I reemphasized that patient needs to follow-up with [place] on 06/02/2022 after ultrasound to determine dating/additional testing that needs to be done.  I could not auscultate any fetal heart tones on examination today. It will obtain quantitative hCG today.  I am unsure if patient is pregnant given weight loss, has been breast-feeding 6 times a day.  Denies any vaginal bleeding/leaking of fluid or pelvic pain. History of seizure during last pregnancy 2021-had been prescribed Keppra by neurology at that time.  May need referral to high risk or neurology pending quantitative hCG. Weight-will need close follow-up for poor weight gain.  108 pounds in October and is now 101.  Down slightly from last visit a month ago.  BMI of 16. Elevated blood pressure-had elevated BP 140/93 and MAU on 02/28/2022.  Does not have elevated pressure today.   Follow up 4 weeks for next prenatal visit.

## 2022-05-31 ENCOUNTER — Other Ambulatory Visit (HOSPITAL_COMMUNITY): Payer: Self-pay

## 2022-05-31 LAB — BETA HCG QUANT (REF LAB): hCG Quant: 93942 m[IU]/mL

## 2022-06-01 ENCOUNTER — Other Ambulatory Visit: Payer: Self-pay | Admitting: Family Medicine

## 2022-06-01 ENCOUNTER — Ambulatory Visit
Admission: RE | Admit: 2022-06-01 | Discharge: 2022-06-01 | Disposition: A | Discharge status: Home / Self Care | Payer: Medicaid Other | Source: Ambulatory Visit | Attending: Family Medicine | Admitting: Family Medicine

## 2022-06-01 DIAGNOSIS — Z3490 Encounter for supervision of normal pregnancy, unspecified, unspecified trimester: Secondary | ICD-10-CM

## 2022-06-01 DIAGNOSIS — Z3A01 Less than 8 weeks gestation of pregnancy: Secondary | ICD-10-CM | POA: Insufficient documentation

## 2022-06-01 DIAGNOSIS — Z3687 Encounter for antenatal screening for uncertain dates: Secondary | ICD-10-CM | POA: Insufficient documentation

## 2022-06-02 ENCOUNTER — Ambulatory Visit (INDEPENDENT_AMBULATORY_CARE_PROVIDER_SITE_OTHER): Payer: Medicaid Other | Admitting: Family Medicine

## 2022-06-02 ENCOUNTER — Other Ambulatory Visit: Payer: Self-pay

## 2022-06-02 ENCOUNTER — Encounter (HOSPITAL_COMMUNITY): Payer: Self-pay | Admitting: Student

## 2022-06-02 VITALS — BP 131/75 | HR 95 | Wt 103.4 lb

## 2022-06-02 DIAGNOSIS — Z3A01 Less than 8 weeks gestation of pregnancy: Secondary | ICD-10-CM

## 2022-06-02 DIAGNOSIS — Z3490 Encounter for supervision of normal pregnancy, unspecified, unspecified trimester: Secondary | ICD-10-CM | POA: Diagnosis not present

## 2022-06-02 NOTE — Progress Notes (Signed)
    SUBJECTIVE:   CHIEF COMPLAINT / HPI:   Hameed, in-person Pashto interpreter, present for entirety of visit.   Gabrielle Wilson is a 22 y.o. female who presents, accompanied by husband, to the [facility] clinic today to discuss the following concerns:   Follow-up ultrasound Patient was last seen on 1/8 for her initial prenatal visit. Per last note " Patient had positive pregnancy test in October and bleeding that sent her to the MAU.  At that time had an ultrasound which did not show fetal pole or fetal cardiac activity. Thought to be either very early normal IUP or failed gestation with incomplete abortion. They recommended serial HCG and pelvic ultrasound in another 1-2 weeks but patient did not get this." Given uncertain LMP a dating ultasound was ordered. Imaging showed an absent yolk sac, absent embryo, absent cardiac activity.  She was noted to have a small to moderate subchorionic hemorrhage anteriorly.  An intrauterine gestational sac was present and "may be slightly malpositioned" within uterus. SHe was recommended for short term follow up ultrasound to assess viability.   She denies any concerns today. No vaginal bleeding.  She has not yet started prenatal vitamins as her husband reports they were too expensive.   PERTINENT  PMH / PSH: ?Seizures   OBJECTIVE:   BP 131/75   Pulse 95   Wt 103 lb 6.4 oz (46.9 kg)   LMP  (LMP Unknown)   BMI 17.21 kg/m    General: NAD, pleasant, able to participate in exam Cardiac: RRR Respiratory: CTAB, normal effort, No wheezes, rales or rhonchi Abdomen: nontender, nondistended, uterus not appreciated above umbilicus  Psych: Normal affect and mood  ASSESSMENT/PLAN:   Pregnancy Discussed concern with patient and husband that she may be miscarrying again. No evidence of cardiac activity was seen on her last U/S. No need to repeat beta-hCG today. -Repeat ultrasound scheduled for 1/25 -Follow-up with me scheduled on 1/26 to go over ultrasound  results. -Encouraged daily prenatal vitamins (discussed they could get this over the counter as Medicaid generally does not cover)  -Bleeding precautions provided    Sharion Settler, [place]

## 2022-06-02 NOTE — Assessment & Plan Note (Signed)
Discussed concern with patient and husband that she may be miscarrying again. No evidence of cardiac activity was seen on her last U/S. No need to repeat beta-hCG today. -Repeat ultrasound scheduled for 1/25 -Follow-up with me scheduled on 1/26 to go over ultrasound results. -Encouraged daily prenatal vitamins (discussed they could get this over the counter as Medicaid generally does not cover)  -Bleeding precautions provided

## 2022-06-07 ENCOUNTER — Encounter: Payer: Self-pay | Admitting: Family Medicine

## 2022-06-07 ENCOUNTER — Inpatient Hospital Stay (HOSPITAL_COMMUNITY): Payer: Medicaid Other

## 2022-06-07 ENCOUNTER — Inpatient Hospital Stay (HOSPITAL_COMMUNITY)
Admission: AD | Admit: 2022-06-07 | Discharge: 2022-06-07 | Disposition: A | Discharge status: Home / Self Care | Payer: Medicaid Other | Attending: Family Medicine | Admitting: Family Medicine

## 2022-06-07 DIAGNOSIS — O209 Hemorrhage in early pregnancy, unspecified: Secondary | ICD-10-CM | POA: Diagnosis present

## 2022-06-07 DIAGNOSIS — O021 Missed abortion: Secondary | ICD-10-CM | POA: Diagnosis not present

## 2022-06-07 DIAGNOSIS — Z3A01 Less than 8 weeks gestation of pregnancy: Secondary | ICD-10-CM | POA: Diagnosis not present

## 2022-06-07 DIAGNOSIS — O039 Complete or unspecified spontaneous abortion without complication: Secondary | ICD-10-CM

## 2022-06-07 LAB — CBC
HCT: 33.1 % — ABNORMAL LOW (ref 36.0–46.0)
Hemoglobin: 11.7 g/dL — ABNORMAL LOW (ref 12.0–15.0)
MCH: 28.9 pg (ref 26.0–34.0)
MCHC: 35.3 g/dL (ref 30.0–36.0)
MCV: 81.7 fL (ref 80.0–100.0)
Platelets: 204 10*3/uL (ref 150–400)
RBC: 4.05 MIL/uL (ref 3.87–5.11)
RDW: 13.1 % (ref 11.5–15.5)
WBC: 6.7 10*3/uL (ref 4.0–10.5)
nRBC: 0 % (ref 0.0–0.2)

## 2022-06-07 LAB — HCG, QUANTITATIVE, PREGNANCY: hCG, Beta Chain, Quant, S: 49650 m[IU]/mL — ABNORMAL HIGH (ref <5)

## 2022-06-07 NOTE — MAU Note (Signed)
.  Gabrielle Wilson is a 22 y.o. at Unknown here in MAU reporting: c/o pain in her lower abd that  started this morning withj some spotting when she wipes. Thinks she had a miscarriage in October has not had a period since. Had a BHCG 2-3 days ago and u/s .    LMP: unknown Onset of complaint: today Pain score: 4 Vitals:   06/07/22 1136  BP: 119/64  Pulse: (!) 109  Resp: 18  Temp: 98.6 F (37 C)     FHT:n/a Lab orders placed from triage:

## 2022-06-07 NOTE — MAU Provider Note (Signed)
History     CSN: [id number]  Arrival date and time: 06/07/22 1040   Event Date/Time   First Provider Initiated Contact with Patient 06/07/22 1139      Chief Complaint  Patient presents with   Abdominal Pain   Vaginal Bleeding   HPI  Agusta Hackenberg is a 22 y.o. G2P1001 at approximately [redacted] weeks gestation who presents for evaluation of vaginal bleeding. Patient reports she is seeing a small amount of bright red spotting when she wipes. She also reports intermittent lower abdominal cramping that she rates a 4/10 and has not tried anything for the pain. She denies any discharge. Denies any constipation, diarrhea or any urinary complaints.   She had a miscarriage in October and has not had another period since then. She had an ultrasound done on 1/10 that showed a single gestational sac measuring 6 weeks 3 days without yolk sac or fetal pole and small [facility].    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  No family history on file.  Social History   Tobacco Use   Smoking status: Never   Smokeless tobacco: Never  Vaping Use   Vaping Use: Never used  Substance Use Topics   Alcohol use: Not Currently   Drug use: Not Currently    Allergies:  Allergies  Allergen Reactions   Pork-Derived Products Other (See Comments)    No medications prior to admission.    Review of Systems Physical Exam   Blood pressure 119/64, pulse (!) 109, temperature 98.6 F (37 C), resp. rate 18, currently breastfeeding.  Patient Vitals for the past 24 hrs:  BP Temp Pulse Resp  06/07/22 1136 119/64 98.6 F (37 C) (!) 109 18    Physical Exam  Fetal  Tracing:  Baseline: Variability: Accels: Decels:  Toco:      MAU Course  Procedures  Results for orders placed or performed during the hospital encounter of 06/07/22 (from the past 24 hour(s))  CBC     Status: Abnormal   Collection Time: 06/07/22 10:59 AM  Result Value Ref Range   WBC 6.7 4.0 - 10.5 K/uL   RBC 4.05 3.87 - 5.11 MIL/uL   Hemoglobin 11.7 (L) 12.0 - 15.0 g/dL   HCT 33.1 (L) 36.0 - 46.0 %   MCV 81.7 80.0 - 100.0 fL   MCH 28.9 26.0 - 34.0 pg   MCHC 35.3 30.0 - 36.0 g/dL   RDW 13.1 11.5 - 15.5 %   Platelets 204 150 - 400 K/uL   nRBC 0.0 0.0 - 0.2 %  hCG, quantitative, pregnancy     Status: Abnormal   Collection Time: 06/07/22 10:59 AM  Result Value Ref Range   hCG, Beta Chain, Quant, S 49,650 (H) <5 mIU/mL     US OB Transvaginal  Result Date: 06/07/2022 CLINICAL DATA:  Vaginal bleeding.  Pregnant EXAM: TRANSVAGINAL OB ULTRASOUND TECHNIQUE: Transvaginal ultrasound was  performed for complete evaluation of the gestation as well as the maternal uterus, adnexal regions, and pelvic cul-de-sac. COMPARISON:  06/01/2022 ultrasound FINDINGS: Uterus: Preserved contours of the uterus. Heterogeneous myometrium. Slightly irregular fluid collection along the endometrium measuring 18 x 13 x 11 mm. No yolk sac, embryo. No sonographically confirmed IUP. Left ovary has small follicles. Blood flow on color Doppler. Left ovary measures 2.7 x 2.4 x 2.2 cm. Right ovary appears similar measuring 3.8 x 1.7 by 2.9 cm. No significant free fluid in the pelvis. IMPRESSION: No clear IUP with a irregular area of fluid along the endometrium. No significant free fluid in the pelvis. With the patient's history differential would include blighted ovum, spontaneous abortion. Please correlate with patient's beta HCG. Recommend close follow-up Electronically Signed   By: Jill Side M.D.   On: 06/07/2022 12:24     MDM Labs ordered and reviewed.   CBC, HCG B Pos US OB Transvaginal  CNM consulted  with Dr. Rip Harbour regarding presentation and results- MD states this is likely miscarriage in process.   CNM reviewed results with patient at length. Patient understandably upset and requesting testing as to why she's had 2 miscarriages. Discussed outpatient follow up in 1 week at [facility] for HCG and 2 weeks with a provider. Patient agreeable to plan of care.   Assessment and Plan   1. Miscarriage   2. [redacted] weeks gestation of pregnancy     -Discharge home in stable condition -Vaginal bleeding and pain precautions discussed -Patient advised to follow-up with [facility] in 1 week for repeat HCG -Patient may return to MAU as needed or if her condition were to change or worsen  Wende Mott, CNM 06/07/2022, 11:39 AM

## 2022-06-07 NOTE — Progress Notes (Signed)
Patient called with interpreter and is pregnant. Complaint was bleedings and cramping, spoke with Janett Billow recommended patient going to MAU. Patient has not transportation. We provided patient taxi voucher per [place].

## 2022-06-14 ENCOUNTER — Inpatient Hospital Stay (HOSPITAL_COMMUNITY): Payer: Medicaid Other

## 2022-06-14 ENCOUNTER — Other Ambulatory Visit: Payer: Self-pay

## 2022-06-14 ENCOUNTER — Inpatient Hospital Stay (HOSPITAL_COMMUNITY)
Admission: AD | Admit: 2022-06-14 | Discharge: 2022-06-14 | Disposition: A | Discharge status: Home / Self Care | Payer: Medicaid Other | Attending: Obstetrics and Gynecology | Admitting: Obstetrics and Gynecology

## 2022-06-14 DIAGNOSIS — O209 Hemorrhage in early pregnancy, unspecified: Secondary | ICD-10-CM | POA: Diagnosis present

## 2022-06-14 DIAGNOSIS — O039 Complete or unspecified spontaneous abortion without complication: Secondary | ICD-10-CM

## 2022-06-14 LAB — CBC
HCT: 31.9 % — ABNORMAL LOW (ref 36.0–46.0)
Hemoglobin: 11.5 g/dL — ABNORMAL LOW (ref 12.0–15.0)
MCH: 29.4 pg (ref 26.0–34.0)
MCHC: 36.1 g/dL — ABNORMAL HIGH (ref 30.0–36.0)
MCV: 81.6 fL (ref 80.0–100.0)
Platelets: 212 10*3/uL (ref 150–400)
RBC: 3.91 MIL/uL (ref 3.87–5.11)
RDW: 13.2 % (ref 11.5–15.5)
WBC: 5.3 10*3/uL (ref 4.0–10.5)
nRBC: 0 % (ref 0.0–0.2)

## 2022-06-14 LAB — HCG, QUANTITATIVE, PREGNANCY: hCG, Beta Chain, Quant, S: 15251 m[IU]/mL — ABNORMAL HIGH (ref <5)

## 2022-06-14 MED ORDER — ONDANSETRON 4 MG PO TBDP: 4.0000 mg | ORAL_TABLET | Freq: Four times a day (QID) | ORAL | 0 refills | Status: AC | PRN

## 2022-06-14 MED ORDER — IBUPROFEN 600 MG PO TABS: 600.0000 mg | ORAL_TABLET | Freq: Four times a day (QID) | ORAL | 0 refills | Status: AC | PRN

## 2022-06-14 MED ORDER — MISOPROSTOL 200 MCG PO TABS: ORAL_TABLET | ORAL | 1 refills | Status: AC

## 2022-06-14 NOTE — MAU Note (Signed)
Gabrielle Wilson is a 22 y.o. here in MAU reporting: thinks she had a miscarriage, having abdominal pain and bleeding for a week. States bleeding is "not to heavy". Is wearing a pad and is changing 2-3 times per day.   Onset of complaint: ongoing  Pain score: 7/10  Vitals:   06/14/22 1052  BP: 112/68  Pulse: (!) 101  Resp: 16  Temp: 98.5 F (36.9 C)  SpO2: 100%     FHT:NA  Lab orders placed from triage: none

## 2022-06-16 ENCOUNTER — Ambulatory Visit: Admission: RE | Admit: 2022-06-16 | Payer: Medicaid Other | Source: Ambulatory Visit

## 2022-06-17 ENCOUNTER — Encounter: Payer: Self-pay | Admitting: Family Medicine

## 2022-06-17 NOTE — Progress Notes (Deleted)
    SUBJECTIVE:   CHIEF COMPLAINT / HPI:   Jeraldine Primeau is a 22 y.o. female who presents to the [facility] clinic today to discuss the following concerns:   Miscarriage Since patient's last visit on 1/11 she has had a couple MD visits.  She presented on 1/16 with vaginal bleeding was thought to be undergoing a miscarriage.  Ultrasound at that time showed no clear intrauterine pregnancy with a irregular area of fluid along the endometrium.  Differential includes blighted ovum, spontaneous abortion. Her beta HCG was 49,650.  She was recommended to follow-up in our clinic for hCG. When she presented again to the MAU on 1/23 her beta hCG had decreased to 15,251.   Patient also experienced a miscarriage in October.    PERTINENT  PMH / PSH: ***  OBJECTIVE:   LMP  (LMP Unknown)    General: NAD, pleasant, able to participate in exam Cardiac: RRR, no murmurs. Respiratory: CTAB, normal effort, No wheezes, rales or rhonchi Abdomen: Bowel sounds present, nontender, nondistended, no hepatosplenomegaly. Extremities: no edema or cyanosis. Skin: warm and dry, no rashes noted Neuro: alert, no obvious focal deficits Psych: Normal affect and mood  ASSESSMENT/PLAN:   No problem-specific Assessment & Plan notes found for this encounter.     Sharion Settler, [place]

## 2022-06-30 ENCOUNTER — Encounter: Payer: Self-pay | Admitting: Family Medicine

## 2022-06-30 ENCOUNTER — Ambulatory Visit (INDEPENDENT_AMBULATORY_CARE_PROVIDER_SITE_OTHER): Payer: Medicaid Other | Admitting: Family Medicine

## 2022-06-30 VITALS — BP 100/70 | Temp 98.1°F | Ht 65.0 in | Wt 102.8 lb

## 2022-06-30 DIAGNOSIS — O039 Complete or unspecified spontaneous abortion without complication: Secondary | ICD-10-CM | POA: Diagnosis present

## 2022-06-30 DIAGNOSIS — Z3A01 Less than 8 weeks gestation of pregnancy: Secondary | ICD-10-CM | POA: Diagnosis not present

## 2022-06-30 MED ORDER — PRENATAL VITAMINS 28-0.8 MG PO TABS: ORAL_TABLET | ORAL | 3 refills | Status: AC

## 2022-06-30 NOTE — Progress Notes (Signed)
    SUBJECTIVE:   CHIEF COMPLAINT / HPI:   Patient  presents to discuss recent miscarriage about 2 weeks ago. She states that this is her 3rd miscarriage since having their only child almost 2 years ago. She is wondering what could be the problem. Endorses feeling ok and states her bleeding has slowed down significantly and stomach discomfort is improved. She has not been taking a prenatal MVI. They are open to specialist referral   PERTINENT  PMH / PSH: Reviewed   OBJECTIVE:   BP 100/70   Temp 98.1 F (36.7 C)   Ht 5\' 5"  (1.651 m)   Wt 102 lb 12.8 oz (46.6 kg)   LMP  (LMP Unknown)   SpO2 99%   Breastfeeding Unknown   BMI 17.11 kg/m    General: alert, NAD CV: RRR no murmurs Resp: CTAB normal [facility] GI: soft, non distended, non tender   ASSESSMENT/PLAN:   Miscarriage Per family this is 3rd miscarriage since first child almost 2 years ago. Discussed most common reason for miscarriages being chromosomal abnormalities. Reassuring that she has been able to conceive. Will refer to [facility] for potential further work up and management. Sent in rx for prenatal vitamin.     [place]

## 2022-06-30 NOTE — Patient Instructions (Addendum)
It was great seeing you today!  I have placed a referral to a specialist for your miscarriages. They will call you in the next 2 weeks to schedule an appointment. Please let us know if you do not hear form them.   I have sent a prenatal vitamin to your pharmacy. Please take this daily.   Feel free to call with any questions or concerns at any time, at [phone].   Take care,  Dr. Shary Key [place] [facility]  ?? ????? ??? ???? ??? ?? ??!  ?? ????? ? ??? ????? ?? ????? ?? ???? ???. ??? ?? ???? ?? ?? ???????? 2 ????? ?? ??? ???? ???? ? ?????? ??????? ????. ??????? ???? ??? ?? ??? ????? ?? ???? ? ??? ????? ?? ????.  ?? ????? ??????? ?? ? ????? ???? ??????? ????? ??. ??????? ???? ?? ??? ??? ?????.  ?? ?? ??? ?? ? ??? ?????? ?? ??????? ??? ?? [phone] ?? ?????? ????.

## 2022-07-02 DIAGNOSIS — O039 Complete or unspecified spontaneous abortion without complication: Secondary | ICD-10-CM | POA: Insufficient documentation

## 2022-07-02 NOTE — Assessment & Plan Note (Signed)
Per family this is 3rd miscarriage since first child almost 2 years ago. Discussed most common reason for miscarriages being chromosomal abnormalities. Reassuring that she has been able to conceive. Will refer to [facility] for potential further work up and management. Sent in rx for prenatal vitamin.

## 2022-08-16 ENCOUNTER — Ambulatory Visit: Payer: Medicaid Other | Admitting: Obstetrics and Gynecology

## 2022-11-25 IMAGING — US US MFM OB DETAIL+14 WK
1 series · 13 of 28 positions shown · non-contrast
Comparison: none

[Series 1: us mfm ob detail+14 wk · 125 acquisitions, 13 frames shown]
[im 5/125]
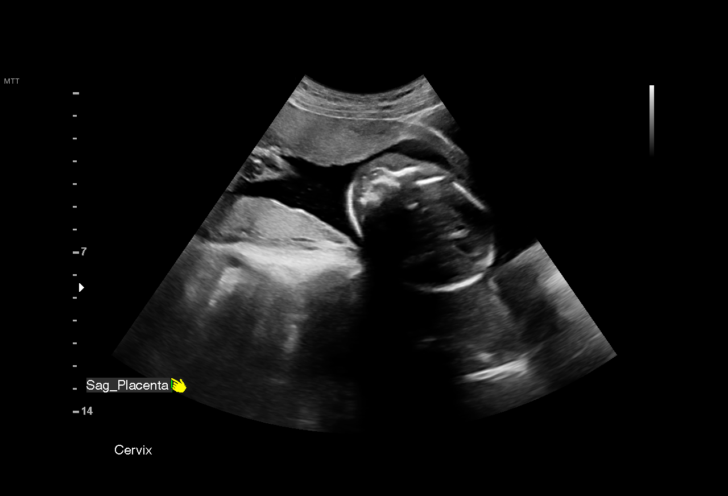
[im 14/125]
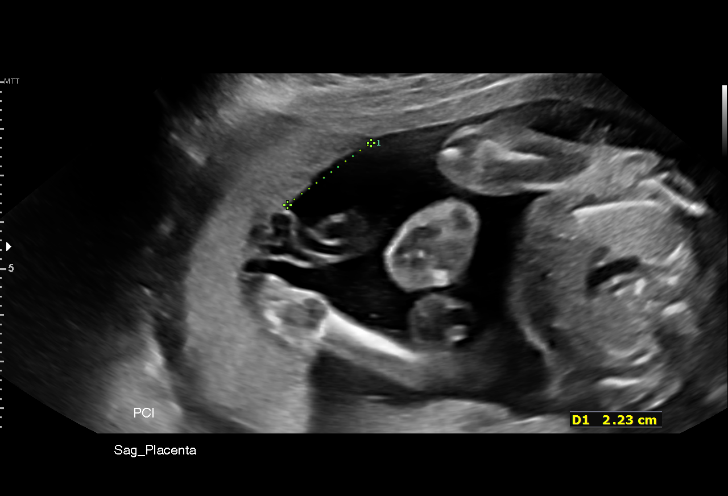
[im 23/125]
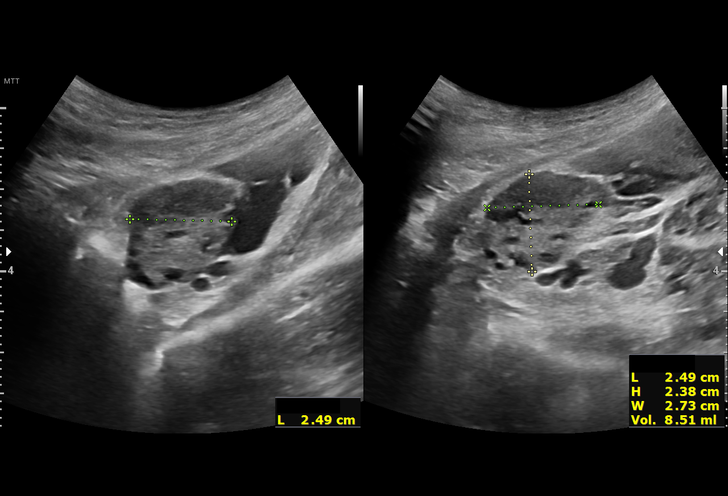
[im 33/125]
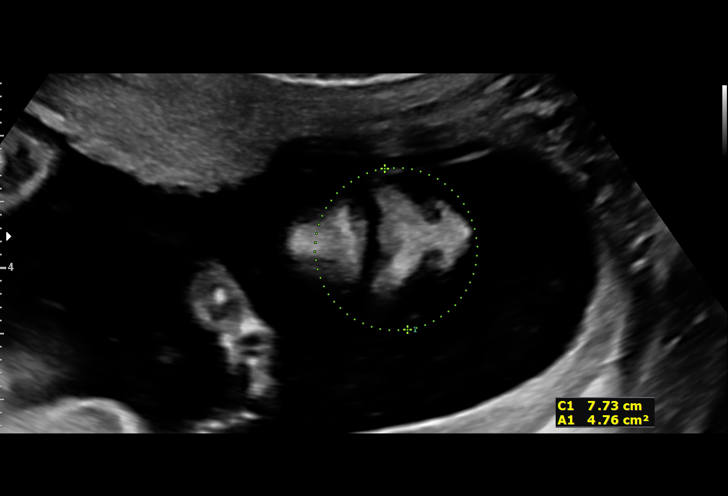
[im 42/125]
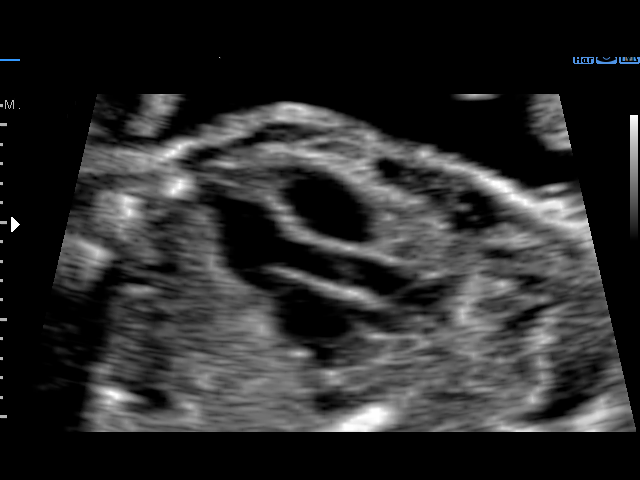
[im 51/125]
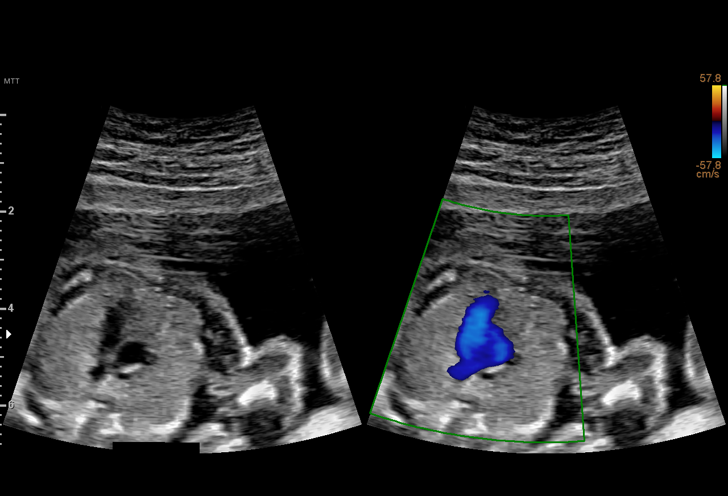
[im 65/125]
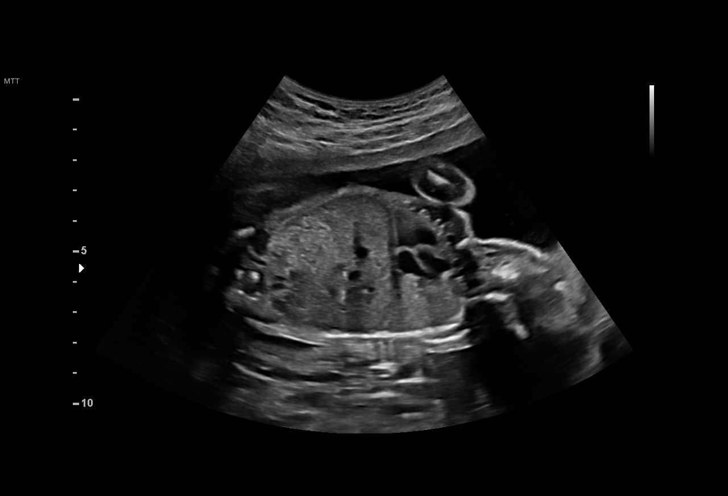
[im 74/125]
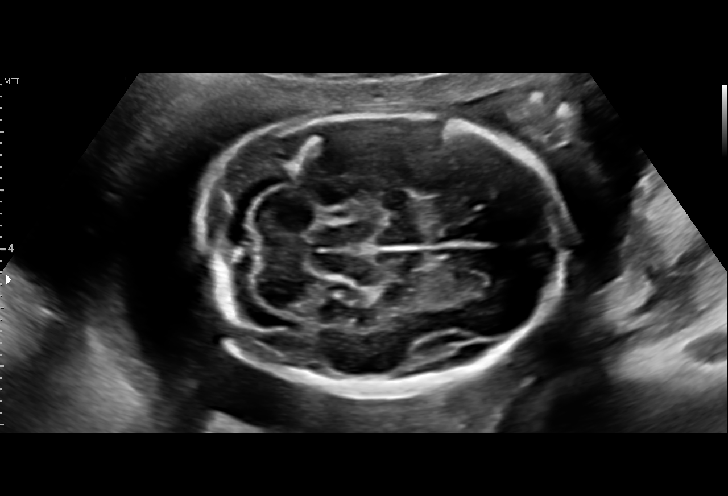
[im 83/125]
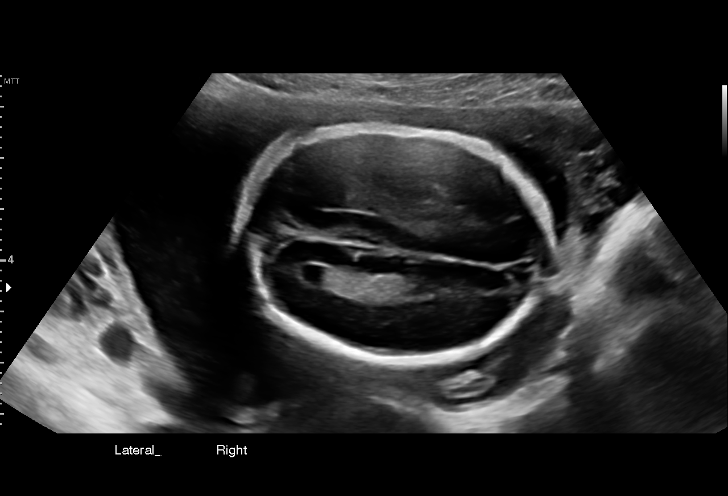
[im 92/125]
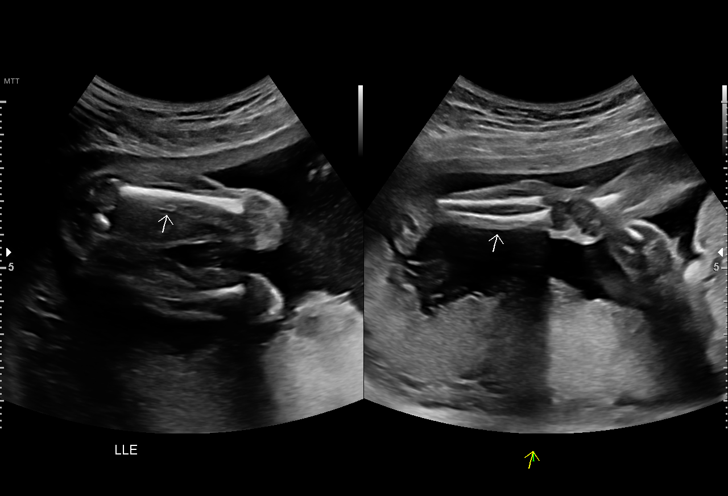
[im 102/125]
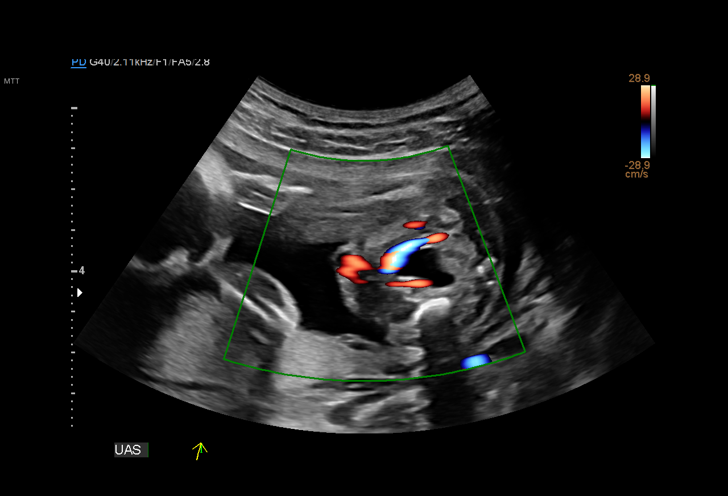
[im 111/125]
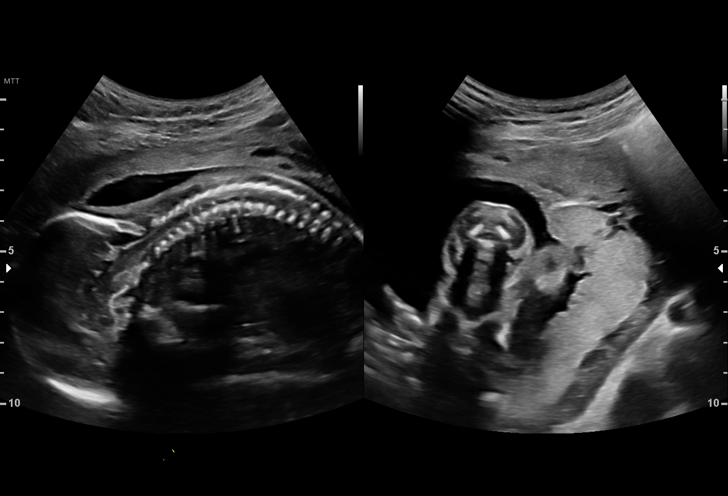
[im 120/125]
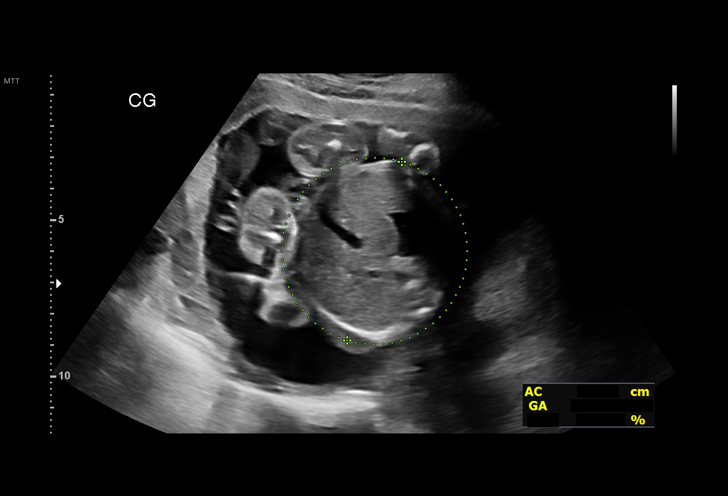

[13 of 28 positions shown; findings below may reference images not displayed]

[REDACTED]

Indications

 Medical complication of pregnancy (Seizure
 Disorder, No Medication)
 20 weeks gestation of pregnancy
 Encounter for antenatal screening for
 malformations
 Anemia during pregnancy in second trimester
Fetal Evaluation

 Num Of Fetuses:         1
 Fetal Heart Rate(bpm):  155
 Cardiac Activity:       Observed
 Presentation:           Variable
 Placenta:               Left lateral Fundal
 P. Cord Insertion:      Visualized, central

 Amniotic Fluid
 AFI FV:      Within normal limits

                             Largest Pocket(cm)

Biometry

 BPD:      46.1  mm     G. Age:  20w 0d         24  %    CI:        66.33   %    70 - 86
                                                         FL/HC:      20.7   %    15.9 -
 HC:      181.5  mm     G. Age:  20w 4d         40  %    HC/AC:      1.12        1.06 -
 AC:      162.7  mm     G. Age:  21w 2d         69  %    FL/BPD:     81.6   %
 FL:       37.6  mm     G. Age:  22w 0d         86  %    FL/AC:      23.1   %    20 - 24
 HUM:      34.5  mm     G. Age:  21w 6d         81  %
 CER:      22.8  mm     G. Age:  21w 2d         86  %
 NFT:       4.8  mm

 LV:        5.5  mm
 CM:        4.3  mm

 Est. FW:     426  gm    0 lb 15 oz      89  %
OB History

 Blood Type:   B+
 Gravidity:    1         Term:   0        Prem:   0        SAB:   0
 TOP:          0       Ectopic:  0        Living: 0
Gestational Age

 LMP:           20w 4d        Date:  01/13/20                 EDD:   10/19/20
 U/S Today:     21w 0d                                        EDD:   10/16/20
 Best:          20w 4d     Det. By:  LMP  (01/13/20)          EDD:   10/19/20
Anatomy

 Cranium:               Appears normal         LVOT:                   Appears normal
 Cavum:                 Appears normal         Aortic Arch:            Appears normal
 Ventricles:            Appears normal         Ductal Arch:            Appears normal
 Choroid Plexus:        Appears normal         Diaphragm:              Appears normal
 Cerebellum:            Appears normal         Stomach:                Appears normal, left
                                                                       sided
 Posterior Fossa:       Appears normal         Abdomen:                Appears normal
 Nuchal Fold:           Appears normal         Abdominal Wall:         Appears nml (cord
                                                                       insert, abd wall)
 Face:                  Appears normal         Cord Vessels:           Appears normal (3
                        (orbits and profile)                           vessel cord)
 Lips:                  Appears normal         Kidneys:                Appear normal
 Palate:                Appears normal         Bladder:                Appears normal
 Thoracic:              Appears normal         Spine:                  Not well visualized
 Heart:                 Appears normal         Upper Extremities:      Appears normal
                        (4CH, axis, and
                        situs)
 RVOT:                  Appears normal         Lower Extremities:      Appears normal

 Other:  Fetus appears to be female. Nasal bone, Open hands, Heels and
         Right 5th digit visualized. Technically difficult due to fetal position.
Cervix Uterus Adnexa

 Cervix
 Normal appearance by transabdominal scan.

 Uterus
 No abnormality visualized.

 Right Ovary
 Within normal limits.

 Left Ovary
 Within normal limits.

 Cul De Sac
 No free fluid seen.
 Adnexa
 No abnormality visualized.
Comments

 This patient was seen for a detailed fetal anatomy scan.
 She reports a history of a seizure disorder that is not currently
 treated with any medications.  She has a neurology
 appointment and EEG scheduled on June 17, 2020.
 She has declined all screening tests for fetal aneuploidy in
 her current pregnancy.
 She was informed that the fetal growth and amniotic fluid
 level were appropriate for her gestational age.
 There were no obvious fetal anomalies noted on today's
 ultrasound exam.  However, the views of the fetal anatomy
 were limited today due to the fetal position.  Due to her
 religious beliefs, I was not allowed to scan her myself.
 The patient was informed that anomalies may be missed due
 to technical limitations. If the fetus is in a suboptimal position
 or maternal habitus is increased, visualization of the fetus in
 the maternal uterus may be impaired.
 A follow-up exam was scheduled in 4 weeks to complete the
 views of the fetal anatomy.
 All conversations were held with the patient today with the
 help of an Afghani interpreter.

## 2024-01-01 ENCOUNTER — Other Ambulatory Visit (HOSPITAL_BASED_OUTPATIENT_CLINIC_OR_DEPARTMENT_OTHER): Payer: Self-pay
